# Patient Record
Sex: Female | Born: 1990
Health system: Southern US, Community
[De-identification: ages and names within clinical notes are randomized; demographics above are authoritative.]

## PROBLEM LIST (undated history)

## (undated) HISTORY — PX: NO PAST SURGERIES: SHX2092

---

## 2013-07-27 ENCOUNTER — Emergency Department (HOSPITAL_COMMUNITY)
Admission: EM | Admit: 2013-07-27 | Discharge: 2013-07-28 | Disposition: A | Discharge status: Home / Self Care | Payer: Managed Care, Other (non HMO) | Attending: Emergency Medicine | Admitting: Emergency Medicine

## 2013-07-27 DIAGNOSIS — Z3202 Encounter for pregnancy test, result negative: Secondary | ICD-10-CM | POA: Insufficient documentation

## 2013-07-27 DIAGNOSIS — R0602 Shortness of breath: Secondary | ICD-10-CM | POA: Insufficient documentation

## 2013-07-27 DIAGNOSIS — Z79899 Other long term (current) drug therapy: Secondary | ICD-10-CM | POA: Insufficient documentation

## 2013-07-27 DIAGNOSIS — R5381 Other malaise: Secondary | ICD-10-CM | POA: Insufficient documentation

## 2013-07-27 DIAGNOSIS — R0789 Other chest pain: Secondary | ICD-10-CM | POA: Insufficient documentation

## 2013-07-27 DIAGNOSIS — B349 Viral infection, unspecified: Secondary | ICD-10-CM

## 2013-07-27 DIAGNOSIS — B9789 Other viral agents as the cause of diseases classified elsewhere: Secondary | ICD-10-CM | POA: Insufficient documentation

## 2013-07-27 DIAGNOSIS — R000 Tachycardia, unspecified: Secondary | ICD-10-CM | POA: Insufficient documentation

## 2013-07-28 ENCOUNTER — Encounter (HOSPITAL_COMMUNITY): Payer: Self-pay | Admitting: Emergency Medicine

## 2013-07-28 ENCOUNTER — Emergency Department (HOSPITAL_COMMUNITY): Payer: Managed Care, Other (non HMO)

## 2013-07-28 LAB — URINALYSIS, ROUTINE W REFLEX MICROSCOPIC
Glucose, UA: NEGATIVE mg/dL
Hgb urine dipstick: NEGATIVE
Ketones, ur: NEGATIVE mg/dL
Protein, ur: NEGATIVE mg/dL
Urobilinogen, UA: 1 mg/dL (ref 0.0–1.0)

## 2013-07-28 LAB — POCT PREGNANCY, URINE: Preg Test, Ur: NEGATIVE

## 2013-07-28 LAB — URINE MICROSCOPIC-ADD ON

## 2013-07-28 MED ORDER — OSELTAMIVIR PHOSPHATE 75 MG PO CAPS: 75.0000 mg | ORAL_CAPSULE | Freq: Two times a day (BID) | ORAL | Status: DC

## 2013-07-28 MED ORDER — ACETAMINOPHEN 325 MG PO TABS
650.0000 mg | ORAL_TABLET | Freq: Once | ORAL | Status: AC
  Administered 2013-07-28: 650 mg via ORAL
  Filled 2013-07-28: qty 2

## 2013-07-28 NOTE — ED Provider Notes (Signed)
Medical screening examination/treatment/procedure(s) were performed by non-physician practitioner and as supervising physician I was immediately available for consultation/collaboration.    Sunnie Nielsen, MD 07/28/13 [phone]

## 2013-07-28 NOTE — ED Provider Notes (Signed)
CSN: [id number]     Arrival date & time 07/27/13  2356 History   First MD Initiated Contact with Patient 07/28/13 0053     Chief Complaint  Patient presents with  . Fever  . Shortness of Breath   HPI  History provided by the patient and friends. Patient is a 22 year old female with no significant PMH who presents with complaints of fatigue, fever and chills. Patient reports having slight symptoms are than a day but really began to feel worse this evening. She states she feels extremely tired and exhausted. She also has occasional feelings of shortness of breath and tightness. She denies any significant pains in the chest. No coughing. No nasal congestion, sore throat or ear pain. Denies any headache or neck stiffness. She denies any specific known sick contacts. Denies any recent travel. She has not used any treatments for symptoms. No other aggravating or alleviating factors. No other associated symptoms the    History reviewed. No pertinent past medical history. History reviewed. No pertinent past surgical history. History reviewed. No pertinent family history. History  Substance Use Topics  . Smoking status: Never Smoker   . Smokeless tobacco: Never Used  . Alcohol Use: Yes     Comment: occ.   OB History   Grav Para Term Preterm Abortions TAB SAB Ect Mult Living                 Review of Systems  Constitutional: Positive for fever, chills and fatigue.  HENT: Negative for ear pain, rhinorrhea and sore throat.   Respiratory: Negative for cough and wheezing.   Cardiovascular: Positive for chest pain.  Gastrointestinal: Negative for nausea, vomiting, abdominal pain and diarrhea.  Genitourinary: Negative for dysuria, frequency, hematuria, flank pain, vaginal bleeding, vaginal discharge and menstrual problem.  Skin: Negative for rash.  Neurological: Negative for headaches.  All other systems reviewed and are negative.    Allergies  Review of patient's allergies indicates no  known allergies.  Home Medications   Current Outpatient Rx  Name  Route  Sig  Dispense  Refill  . PRESCRIPTION MEDICATION   Oral   Take 1 tablet by mouth daily. Birth control tablet          BP 122/69  Pulse 105  Temp(Src) 99.6 F (37.6 C) (Oral)  Resp 18  Ht 5\' 9"  (1.753 m)  Wt 160 lb (72.576 kg)  BMI 23.62 kg/m2  SpO2 99%  LMP 07/19/2013 Physical Exam  Nursing note and vitals reviewed. Constitutional: She is oriented to person, place, and time. She appears well-developed and well-nourished. No distress.  HENT:  Head: Normocephalic and atraumatic.  Right Ear: Tympanic membrane normal.  Left Ear: Tympanic membrane normal.  Nose: Nose normal.  Mouth/Throat: Uvula is midline, oropharynx is clear and moist and mucous membranes are normal.  Eyes: Conjunctivae and EOM are normal. Pupils are equal, round, and reactive to light.  Neck: Normal range of motion. Neck supple.  No meningeal signs  Cardiovascular: Regular rhythm.  Tachycardia present.   Pulmonary/Chest: Effort normal and breath sounds normal. No respiratory distress. She has no wheezes. She has no rales.  Abdominal: Soft. Bowel sounds are normal. There is no tenderness. There is no rebound, no guarding and no CVA tenderness.  Neurological: She is alert and oriented to person, place, and time.  Skin: Skin is warm and dry. No rash noted.  Psychiatric: She has a normal mood and affect. Her behavior is normal.    ED Course  Procedures  Results for orders placed during the hospital encounter of 07/27/13  URINALYSIS, ROUTINE W REFLEX MICROSCOPIC      Result Value Range   Color, Urine YELLOW  YELLOW   APPearance CLEAR  CLEAR   Specific Gravity, Urine 1.014  1.005 - 1.030   pH 6.5  5.0 - 8.0   Glucose, UA NEGATIVE  NEGATIVE mg/dL   Hgb urine dipstick NEGATIVE  NEGATIVE   Bilirubin Urine NEGATIVE  NEGATIVE   Ketones, ur NEGATIVE  NEGATIVE mg/dL   Protein, ur NEGATIVE  NEGATIVE mg/dL   Urobilinogen, UA 1.0  0.0 -  1.0 mg/dL   Nitrite NEGATIVE  NEGATIVE   Leukocytes, UA TRACE (*) NEGATIVE  URINE MICROSCOPIC-ADD ON      Result Value Range   Squamous Epithelial / LPF FEW (*) RARE   WBC, UA 0-2  <3 WBC/hpf   Bacteria, UA FEW (*) RARE  POCT PREGNANCY, URINE      Result Value Range   Preg Test, Ur NEGATIVE  NEGATIVE       Dg Chest 2 View  07/28/2013   CLINICAL DATA:  Fever, shortness of breath, flu symptoms.  EXAM: CHEST  2 VIEW  COMPARISON:  None.  FINDINGS: Shallow inspiration. The heart size and mediastinal contours are within normal limits. Both lungs are clear. The visualized skeletal structures are unremarkable.  IMPRESSION: No active cardiopulmonary disease.   Electronically Signed   By: Burman Nieves M.D.   On: 07/28/2013 01:15      MDM   1. Viral infection     Patient seen and evaluated. Patient is well-appearing in no acute distress. She does not appear severely ill or toxic. She is febrile. No other sniffed in symptoms associated with fever.  Chest x-ray and UA unremarkable. Patient does report feeling better after Tylenol. Temperature also improved. At this time suspect viral infection. Patient encouraged on plenty of fluids and symptomatic treatment for fever and infection symptoms. She expressed understanding.      Angus Seller, PA-C 07/28/13 [phone]

## 2013-07-28 NOTE — ED Notes (Signed)
Pt reports that she felt like she was getting a cold earlier today, and began having chills, pt reports difficulty breathing and feels tired. Pt is stating she cannot talk at this time but can answer questions.

## 2014-03-12 ENCOUNTER — Emergency Department (HOSPITAL_COMMUNITY)
Admission: EM | Admit: 2014-03-12 | Discharge: 2014-03-12 | Disposition: A | Discharge status: Home / Self Care | Payer: Managed Care, Other (non HMO) | Attending: Emergency Medicine | Admitting: Emergency Medicine

## 2014-03-12 ENCOUNTER — Emergency Department (HOSPITAL_COMMUNITY): Payer: Managed Care, Other (non HMO)

## 2014-03-12 ENCOUNTER — Encounter (HOSPITAL_COMMUNITY): Payer: Self-pay | Admitting: Emergency Medicine

## 2014-03-12 DIAGNOSIS — Z3490 Encounter for supervision of normal pregnancy, unspecified, unspecified trimester: Secondary | ICD-10-CM

## 2014-03-12 DIAGNOSIS — R112 Nausea with vomiting, unspecified: Secondary | ICD-10-CM

## 2014-03-12 DIAGNOSIS — Z3400 Encounter for supervision of normal first pregnancy, unspecified trimester: Secondary | ICD-10-CM | POA: Insufficient documentation

## 2014-03-12 DIAGNOSIS — N898 Other specified noninflammatory disorders of vagina: Secondary | ICD-10-CM | POA: Insufficient documentation

## 2014-03-12 LAB — ABO/RH: ABO/RH(D): O POS

## 2014-03-12 LAB — URINALYSIS, ROUTINE W REFLEX MICROSCOPIC
Bilirubin Urine: NEGATIVE
GLUCOSE, UA: NEGATIVE mg/dL
Hgb urine dipstick: NEGATIVE
KETONES UR: 15 mg/dL — AB
Nitrite: NEGATIVE
PROTEIN: 30 mg/dL — AB
Specific Gravity, Urine: 1.028 (ref 1.005–1.030)
Urobilinogen, UA: 1 mg/dL (ref 0.0–1.0)
pH: 5.5 (ref 5.0–8.0)

## 2014-03-12 LAB — CBC WITH DIFFERENTIAL/PLATELET
BASOS ABS: 0.1 10*3/uL (ref 0.0–0.1)
BASOS PCT: 1 % (ref 0–1)
Eosinophils Absolute: 0.1 10*3/uL (ref 0.0–0.7)
Eosinophils Relative: 1 % (ref 0–5)
HCT: 34.7 % — ABNORMAL LOW (ref 36.0–46.0)
Hemoglobin: 11.8 g/dL — ABNORMAL LOW (ref 12.0–15.0)
LYMPHS PCT: 17 % (ref 12–46)
Lymphs Abs: 1.6 10*3/uL (ref 0.7–4.0)
MCH: 26.5 pg (ref 26.0–34.0)
MCHC: 34 g/dL (ref 30.0–36.0)
MCV: 77.8 fL — ABNORMAL LOW (ref 78.0–100.0)
Monocytes Absolute: 0.8 10*3/uL (ref 0.1–1.0)
Monocytes Relative: 8 % (ref 3–12)
NEUTROS ABS: 7 10*3/uL (ref 1.7–7.7)
Neutrophils Relative %: 73 % (ref 43–77)
PLATELETS: 374 10*3/uL (ref 150–400)
RBC: 4.46 MIL/uL (ref 3.87–5.11)
RDW: 15.2 % (ref 11.5–15.5)
WBC: 9.6 10*3/uL (ref 4.0–10.5)

## 2014-03-12 LAB — COMPREHENSIVE METABOLIC PANEL
ALK PHOS: 43 U/L (ref 39–117)
ALT: 12 U/L (ref 0–35)
AST: 16 U/L (ref 0–37)
Albumin: 3.8 g/dL (ref 3.5–5.2)
BILIRUBIN TOTAL: 0.6 mg/dL (ref 0.3–1.2)
BUN: 10 mg/dL (ref 6–23)
CO2: 22 mEq/L (ref 19–32)
Calcium: 9.5 mg/dL (ref 8.4–10.5)
Chloride: 101 mEq/L (ref 96–112)
Creatinine, Ser: 0.72 mg/dL (ref 0.50–1.10)
GFR calc non Af Amer: >90 mL/min (ref >90)
Glucose, Bld: 87 mg/dL (ref 70–99)
POTASSIUM: 4.2 meq/L (ref 3.7–5.3)
Sodium: 136 mEq/L — ABNORMAL LOW (ref 137–147)
TOTAL PROTEIN: 7 g/dL (ref 6.0–8.3)

## 2014-03-12 LAB — HIV ANTIBODY (ROUTINE TESTING W REFLEX): HIV: NONREACTIVE

## 2014-03-12 LAB — WET PREP, GENITAL
Trich, Wet Prep: NONE SEEN
Yeast Wet Prep HPF POC: NONE SEEN

## 2014-03-12 LAB — URINE MICROSCOPIC-ADD ON

## 2014-03-12 LAB — LIPASE, BLOOD: Lipase: 21 U/L (ref 11–59)

## 2014-03-12 LAB — HCG, QUANTITATIVE, PREGNANCY: hCG, Beta Chain, Quant, S: 35229 m[IU]/mL — ABNORMAL HIGH (ref <5)

## 2014-03-12 LAB — POC URINE PREG, ED: PREG TEST UR: POSITIVE — AB

## 2014-03-12 MED ORDER — PROMETHAZINE HCL 25 MG PO TABS: 25.0000 mg | ORAL_TABLET | Freq: Four times a day (QID) | ORAL | Status: DC | PRN

## 2014-03-12 MED ORDER — PROMETHAZINE HCL 25 MG RE SUPP: 25.0000 mg | Freq: Four times a day (QID) | RECTAL | Status: DC | PRN

## 2014-03-12 MED ORDER — PROMETHAZINE HCL 25 MG/ML IJ SOLN
25.0000 mg | Freq: Once | INTRAMUSCULAR | Status: AC
  Administered 2014-03-12: 25 mg via INTRAVENOUS
  Filled 2014-03-12: qty 1

## 2014-03-12 MED ORDER — FOLIC ACID 1 MG PO TABS: 1.0000 mg | ORAL_TABLET | Freq: Every day | ORAL | Status: DC

## 2014-03-12 MED ORDER — PYRIDOXINE HCL 100 MG/ML IJ SOLN
100.0000 mg | Freq: Once | INTRAMUSCULAR | Status: AC
  Administered 2014-03-12: 100 mg via INTRAVENOUS
  Filled 2014-03-12: qty 1

## 2014-03-12 MED ORDER — DOXYLAMINE SUCCINATE (SLEEP) 25 MG PO TABS
25.0000 mg | ORAL_TABLET | Freq: Once | ORAL | Status: DC
  Filled 2014-03-12: qty 1

## 2014-03-12 NOTE — ED Notes (Signed)
phamacy notified of medicines needed

## 2014-03-12 NOTE — ED Notes (Signed)
Report given to Anna, RN 

## 2014-03-12 NOTE — ED Notes (Signed)
Taking PO fluids without problem.

## 2014-03-12 NOTE — Discharge Instructions (Signed)
Pregnancy - First Trimester During sexual intercourse, millions of sperm go into the vagina. Only 1 sperm will penetrate and fertilize the female egg while it is in the Fallopian tube. One week later, the fertilized egg implants into the wall of the uterus. An embryo begins to develop into a baby. At 6 to 8 weeks, the eyes and face are formed and the heartbeat can be seen on ultrasound. At the end of 12 weeks (first trimester), all the baby's organs are formed. Now that you are pregnant, you will want to do everything you can to have a healthy baby. Two of the most important things are to get good prenatal care and follow your caregiver's instructions. Prenatal care is all the medical care you receive before the baby's birth. It is given to prevent, find, and treat problems during the pregnancy and childbirth. PRENATAL EXAMS  During prenatal visits, your weight, blood pressure, and urine are checked. This is done to make sure you are healthy and progressing normally during the pregnancy.  A pregnant woman should gain 25 to 35 pounds during the pregnancy. However, if you are overweight or underweight, your caregiver will advise you regarding your weight.  Your caregiver will ask and answer questions for you.  Blood work, cervical cultures, other necessary tests, and a Pap test are done during your prenatal exams. These tests are done to check on your health and the probable health of your baby. Tests are strongly recommended and done for HIV with your permission. This is the virus that causes AIDS. These tests are done because medicines can be given to help prevent your baby from being born with this infection should you have been infected without knowing it. Blood work is also used to find out your blood type, previous infections, and follow your blood levels (hemoglobin).  Low hemoglobin (anemia) is common during pregnancy. Iron and vitamins are given to help prevent this. Later in the pregnancy, blood  tests for diabetes will be done along with any other tests if any problems develop.  You may need other tests to make sure you and the baby are doing well. CHANGES DURING THE FIRST TRIMESTER  Your body goes through many changes during pregnancy. They vary from person to person. Talk to your caregiver about changes you notice and are concerned about. Changes can include:  Your menstrual period stops.  The egg and sperm carry the genes that determine what you look like. Genes from you and your partner are forming a baby. The female genes determine whether the baby is a boy or a girl.  Your body increases in girth and you may feel bloated.  Feeling sick to your stomach (nauseous) and throwing up (vomiting). If the vomiting is uncontrollable, call your caregiver.  Your breasts will begin to enlarge and become tender.  Your nipples may stick out more and become darker.  The need to urinate more. Painful urination may mean you have a bladder infection.  Tiring easily.  Loss of appetite.  Cravings for certain kinds of food.  At first, you may gain or lose a couple of pounds.  You may have changes in your emotions from day to day (excited to be pregnant or concerned something may go wrong with the pregnancy and baby).  You may have more vivid and strange dreams. HOME CARE INSTRUCTIONS   It is very important to avoid all smoking, alcohol and non-prescribed drugs during your pregnancy. These affect the formation and growth of the baby.  Avoid chemicals while pregnant to ensure the delivery of a healthy infant.  Start your prenatal visits by the 12th week of pregnancy. They are usually scheduled monthly at first, then more often in the last 2 months before delivery. Keep your caregiver's appointments. Follow your caregiver's instructions regarding medicine use, blood and lab tests, exercise, and diet.  During pregnancy, you are providing food for you and your baby. Eat regular, well-balanced  meals. Choose foods such as meat, fish, milk and other low fat dairy products, vegetables, fruits, and whole-grain breads and cereals. Your caregiver will tell you of the ideal weight gain.  You can help morning sickness by keeping soda crackers at the bedside. Eat a couple before arising in the morning. You may want to use the crackers without salt on them.  Eating 4 to 5 small meals rather than 3 large meals a day also may help the nausea and vomiting.  Drinking liquids between meals instead of during meals also seems to help nausea and vomiting.  A physical sexual relationship may be continued throughout pregnancy if there are no other problems. Problems may be early (premature) leaking of amniotic fluid from the membranes, vaginal bleeding, or belly (abdominal) pain.  Exercise regularly if there are no restrictions. Check with your caregiver or physical therapist if you are unsure of the safety of some of your exercises. Greater weight gain will occur in the last 2 trimesters of pregnancy. Exercising will help:  Control your weight.  Keep you in shape.  Prepare you for labor and delivery.  Help you lose your pregnancy weight after you deliver your baby.  Wear a good support or jogging bra for breast tenderness during pregnancy. This may help if worn during sleep too.  Ask when prenatal classes are available. Begin classes when they are offered.  Do not use hot tubs, steam rooms, or saunas.  Wear your seat belt when driving. This protects you and your baby if you are in an accident.  Avoid raw meat, uncooked cheese, cat litter boxes, and soil used by cats throughout the pregnancy. These carry germs that can cause birth defects in the baby.  The first trimester is a good time to visit your dentist for your dental health. Getting your teeth cleaned is okay. Use a softer toothbrush and brush gently during pregnancy.  Ask for help if you have financial, counseling, or nutritional needs  during pregnancy. Your caregiver will be able to offer counseling for these needs as well as refer you for other special needs.  Do not take any medicines or herbs unless told by your caregiver.  Inform your caregiver if there is any mental or physical domestic violence.  Make a list of emergency phone numbers of family, friends, hospital, and police and fire departments.  Write down your questions. Take them to your prenatal visit.  Do not douche.  Do not cross your legs.  If you have to stand for long periods of time, rotate you feet or take small steps in a circle.  You may have more vaginal secretions that may require a sanitary pad. Do not use tampons or scented sanitary pads. MEDICINES AND DRUG USE IN PREGNANCY  Take prenatal vitamins as directed. The vitamin should contain 1 milligram of folic acid. Keep all vitamins out of reach of children. Only a couple vitamins or tablets containing iron may be fatal to a baby or young child when ingested.  Avoid use of all medicines, including herbs, over-the-counter medicines, not  prescribed or suggested by your caregiver. Only take over-the-counter or prescription medicines for pain, discomfort, or fever as directed by your caregiver. Do not use aspirin, ibuprofen, or naproxen unless directed by your caregiver.  Let your caregiver also know about herbs you may be using.  Alcohol is related to a number of birth defects. This includes fetal alcohol syndrome. All alcohol, in any form, should be avoided completely. Smoking will cause low birth rate and premature babies.  Street or illegal drugs are very harmful to the baby. They are absolutely forbidden. A baby born to an addicted mother will be addicted at birth. The baby will go through the same withdrawal an adult does.  Let your caregiver know about any medicines that you have to take and for what reason you take them. SEEK MEDICAL CARE IF:  You have any concerns or worries during your  pregnancy. It is better to call with your questions if you feel they cannot wait, rather than worry about them. SEEK IMMEDIATE MEDICAL CARE IF:   An unexplained oral temperature above 102 F (38.9 C) develops, or as your caregiver suggests.  You have leaking of fluid from the vagina (birth canal). If leaking membranes are suspected, take your temperature and inform your caregiver of this when you call.  There is vaginal spotting or bleeding. Notify your caregiver of the amount and how many pads are used.  You develop a bad smelling vaginal discharge with a change in the color.  You continue to feel sick to your stomach (nauseated) and have no relief from remedies suggested. You vomit blood or coffee ground-like materials.  You lose more than 2 pounds of weight in 1 week.  You gain more than 2 pounds of weight in 1 week and you notice swelling of your face, hands, feet, or legs.  You gain 5 pounds or more in 1 week (even if you do not have swelling of your hands, face, legs, or feet).  You get exposed to [place] measles and have never had them.  You are exposed to fifth disease or chickenpox.  You develop belly (abdominal) pain. Round ligament discomfort is a common non-cancerous (benign) cause of abdominal pain in pregnancy. Your caregiver still must evaluate this.  You develop headache, fever, diarrhea, pain with urination, or shortness of breath.  You fall or are in a car accident or have any kind of trauma.  There is mental or physical violence in your home. Document Released: 09/17/2001 Document Revised: 06/17/2012 Document Reviewed: 03/21/2009 [facility] Patient Information 2014 [place].  Folic Acid in Pregnancy Folic acid is a B vitamin that helps prevent neural tube defects (NTDs). The neural tube is the part of a developing baby that becomes the brain and spinal cord. When the neural tube does not close properly, a baby is born with an NTD. NTDs include spina bifida,  hernia of the spinal cord, and the absence of part of, or all of the brain (anencephaly).  Take folic acid at least 4 weeks before getting pregnant and through the first 3 months of pregnancy. This is when the neural tube is developing. It is available in most multivitamins, as a folic acid-only supplement, and in some foods. Taking the right amount of folic acid before conception and during pregnancy lessens the chances of having a baby born with an NTD. Giving folic acid will not affect a neural tube defect if it is already present. DIAGNOSIS   An Alpha-Fetoprotein (AFP) blood or amniotic fluid test  will show high levels of the alpha-feto protein if a woman is carrying a baby with an NTD. This test is done on all pregnant women in the first trimester.  An ultrasound may detect an NTD. WHAT YOU CAN DO:  Take a multivitamin with at least 0.4 milligrams (400 micrograms) of folic acid daily at least 4 weeks before getting pregnant and through the first 12 weeks of pregnancy.  If you have already had a pregnancy affected by an NTD, take 4 milligrams (4,000 micrograms) of folic acid daily. Take this amount 1 month before you start trying to get pregnant and continue through the first 3 months of pregnancy. If you have a seizure disorder or take medicines to control seizures, tell your maternity care provider. Continue to take your folic acid unless you are told otherwise.  FOLIC ACID IN FOODS Eat a healthy diet that has foods that contain folic acid, the natural form of the vitamin. Such foods include:  Fortified breakfast cereals.  Lentils.  Asparagus.  Spinach.  Organ meats (liver).  Black beans.  Peanuts (eat only if you do not have a peanut allergy).  Broccoli.  Strawberries, oranges.  Orange juice (from concentrate is best).  Enriched breads and pasta.  Romaine lettuce. TALK TO YOUR HEALTH CARE PROVIDER IF:  You are in your first trimester and have high blood sugar.  You  are in your first trimester and develop a high fever. In almost all cases, a fetus found to have an NTD will need specialized care that may not be available in all hospitals. Talk to your health care provider about what is best for you and your baby. Document Released: 09/26/2003 Document Revised: 07/14/2013 Document Reviewed: 12/27/2009 [facility] Patient Information 2014 [place].

## 2014-03-12 NOTE — ED Provider Notes (Signed)
CSN: [id number]     Arrival date & time 03/12/14  1301 History   First MD Initiated Contact with Patient 03/12/14 1438     Chief Complaint  Patient presents with  . Emesis During Pregnancy     (Consider location/radiation/quality/duration/timing/severity/associated sxs/prior Treatment) HPI Comments: Patient is a G34 P74 23 year old female who is otherwise healthy who presents today with nausea and vomiting. She states that she took a positive pregnancy test last week and is unsure how far along she is. She has not seen in OB/GYN physician. She  does not have a confirmed IUP. she denies any abdominal pain or cramping. She has an associated vaginal discharge. No vaginal bleeding. Last menstrual period was 01/23/2014. She has been having nonbloody, nonbilious emesis approximately 5-10 minutes after any meals. Initially she was eating large meals, but has started eating small meals. She is still unable to keep any of this down.   The history is provided by the patient. No language interpreter was used.    History reviewed. No pertinent past medical history. History reviewed. No pertinent past surgical history. History reviewed. No pertinent family history. History  Substance Use Topics  . Smoking status: Never Smoker   . Smokeless tobacco: Never Used  . Alcohol Use: Yes     Comment: occ.   OB History   Grav Para Term Preterm Abortions TAB SAB Ect Mult Living                 Review of Systems  Constitutional: Negative for fever and chills.  Respiratory: Negative for shortness of breath.   Cardiovascular: Negative for chest pain.  Gastrointestinal: Positive for nausea and vomiting. Negative for abdominal pain.  Genitourinary: Positive for vaginal discharge. Negative for vaginal bleeding.  All other systems reviewed and are negative.     Allergies  Review of patient's allergies indicates no known allergies.  Home Medications   Prior to Admission medications   Medication Sig Start  Date End Date Taking? Authorizing Provider  oseltamivir (TAMIFLU) 75 MG capsule Take 1 capsule (75 mg total) by mouth every 12 (twelve) hours. 07/28/13   Martie Lee, PA-C  PRESCRIPTION MEDICATION Take 1 tablet by mouth daily. Birth control tablet    Historical Provider, MD   BP 121/49  Pulse 77  Temp(Src) 98.3 F (36.8 C) (Oral)  Resp 18  Ht 5\' 8"  (1.727 m)  Wt 156 lb (70.761 kg)  BMI 23.73 kg/m2  SpO2 97%  LMP 01/23/2014 Physical Exam  Nursing note and vitals reviewed. Constitutional: She is oriented to person, place, and time. She appears well-developed and well-nourished. No distress.  HENT:  Head: Normocephalic and atraumatic.  Right Ear: External ear normal.  Left Ear: External ear normal.  Nose: Nose normal.  Mouth/Throat: Oropharynx is clear and moist.  Eyes: Conjunctivae are normal.  Neck: Normal range of motion.  Cardiovascular: Normal rate, regular rhythm and normal heart sounds.   Pulmonary/Chest: Effort normal and breath sounds normal. No stridor. No respiratory distress. She has no wheezes. She has no rales.  Abdominal: Soft. She exhibits no distension. There is no tenderness.  Genitourinary: There is no rash, tenderness, lesion or injury on the right labia. There is no rash, tenderness, lesion or injury on the left labia. Uterus is not tender. Cervix exhibits discharge. Cervix exhibits no motion tenderness and no friability. Right adnexum displays no mass, no tenderness and no fullness. Left adnexum displays no mass, no tenderness and no fullness. No erythema, tenderness or bleeding around  the vagina. No foreign body around the vagina. No signs of injury around the vagina. Vaginal discharge found.  Cervical os is closed. White malodorous discharge seen on exam.   Musculoskeletal: Normal range of motion.  Neurological: She is alert and oriented to person, place, and time. She has normal strength.  Skin: Skin is warm and dry. She is not diaphoretic. No erythema.   Psychiatric: She has a normal mood and affect. Her behavior is normal.    ED Course  Procedures (including critical care time) Labs Review Labs Reviewed  URINALYSIS, ROUTINE W REFLEX MICROSCOPIC - Abnormal; Notable for the following:    Color, Urine AMBER (*)    APPearance TURBID (*)    Ketones, ur 15 (*)    Protein, ur 30 (*)    Leukocytes, UA SMALL (*)    All other components within normal limits  URINE MICROSCOPIC-ADD ON - Abnormal; Notable for the following:    Squamous Epithelial / LPF MANY (*)    All other components within normal limits  POC URINE PREG, ED - Abnormal; Notable for the following:    Preg Test, Ur POSITIVE (*)    All other components within normal limits  WET PREP, GENITAL  GC/CHLAMYDIA PROBE AMP  CBC WITH DIFFERENTIAL  COMPREHENSIVE METABOLIC PANEL  LIPASE, BLOOD  HCG, QUANTITATIVE, PREGNANCY  HIV ANTIBODY (ROUTINE TESTING)  ABO/RH    Imaging Review US Ob Comp Less 14 Wks  03/12/2014   CLINICAL DATA:  Pelvic pain. Gestational age by LMP of 6 weeks 6 days.  EXAM: OBSTETRIC <14 WK [place] AND TRANSVAGINAL OB US  TECHNIQUE: Both transabdominal and transvaginal ultrasound examinations were performed for complete evaluation of the gestation as well as the maternal uterus, adnexal regions, and pelvic cul-de-sac. Transvaginal technique was performed to assess early pregnancy.  COMPARISON:  None.  FINDINGS: Intrauterine gestational sac: Visualized/normal in shape.  Yolk sac:  Visualized  Embryo:  Visualized  Cardiac Activity: Visualized  Heart Rate:  144 bpm  CRL:   4  mm   6 w 1 d                  [place] EDC: 11/04/2014  Maternal uterus/adnexae: Small right ovarian corpus luteum incidentally noted. Left ovary is normal in appearance. No adnexal mass or free fluid identified.  IMPRESSION: Single living IUP measuring 6 weeks 1 day with [place] EDC of 11/04/2014. This is concordant with LMP.  No significant maternal uterine or adnexal abnormality identified.   Electronically Signed    By: Earle Gell M.D.   On: 03/12/2014 15:48   US Ob Transvaginal  03/12/2014   CLINICAL DATA:  Pelvic pain. Gestational age by LMP of 6 weeks 6 days.  EXAM: OBSTETRIC <14 WK [place] AND TRANSVAGINAL OB US  TECHNIQUE: Both transabdominal and transvaginal ultrasound examinations were performed for complete evaluation of the gestation as well as the maternal uterus, adnexal regions, and pelvic cul-de-sac. Transvaginal technique was performed to assess early pregnancy.  COMPARISON:  None.  FINDINGS: Intrauterine gestational sac: Visualized/normal in shape.  Yolk sac:  Visualized  Embryo:  Visualized  Cardiac Activity: Visualized  Heart Rate:  144 bpm  CRL:   4  mm   6 w 1 d                  [place] EDC: 11/04/2014  Maternal uterus/adnexae: Small right ovarian corpus luteum incidentally noted. Left ovary is normal in appearance. No adnexal mass or free fluid identified.  IMPRESSION: Single  living IUP measuring 6 weeks 1 day with [place] EDC of 11/04/2014. This is concordant with LMP.  No significant maternal uterine or adnexal abnormality identified.   Electronically Signed   By: Earle Gell M.D.   On: 03/12/2014 15:48     EKG Interpretation None      5:13 PM Discussed case with Terri from [facility] who recommends B6, Unisom, and phenergan for symtpoms. Phenergan suppositories can be sent home with patient. Suppositories can be either vaginal or rectal.  6:20 PM Discussed case with Pharmacist. Pharmacy is calling after medication has been given. B6 was large dose IV. Side effects include headache and seizure.   6:31 PM Discussed case with Poison Control who state this is an normal dose to receive IV. The patient and the baby should have no problems.   MDM   Final diagnoses:  Pregnancy  Nausea and vomiting    Patient presents to ED with nausea and vomiting in pregnancy. Patient feels significantly improved after meds in ED and tolerates crackers and ginger ale easily. She was sent home with Phenergan PO and  suppositories. Discussed not to take these together and to use the suppositories when she cannot tolerate any medications PO. Patient was found to have a confirmed IUP on ultrasound measuring 6 weeks and 1 day. She will follow up with an OB/GYN physician. Discussed reasons to return to the ED immediately or to [place] signs stable for discharge. Discussed case with Dr. Canary Brim who agrees with plan. Patient / Family / Caregiver informed of clinical course, understand medical decision-making process, and agree with plan.  Patient did not receive Flagyl in ED. Attempt x 2 was made to call patient to get her Flagyl rx for BV. Flow manager was notified and will get patient Flagyl rx.     Elwyn Lade, PA-C 03/14/14 1011

## 2014-03-12 NOTE — ED Notes (Signed)
Taking  

## 2014-03-12 NOTE — ED Notes (Signed)
Lab at bedside for blood draw.

## 2014-03-12 NOTE — ED Notes (Signed)
Pt reports taking a pregnancy test last week and it was positive but having n/v since then, unable to keep fluids down. No acute distress noted at triage.

## 2014-03-12 NOTE — ED Notes (Signed)
Patient transported to Ultrasound 

## 2014-03-12 NOTE — ED Notes (Signed)
CBC redrawn per lab requests

## 2014-03-14 LAB — GC/CHLAMYDIA PROBE AMP
CT PROBE, AMP APTIMA: NEGATIVE
GC Probe RNA: NEGATIVE

## 2014-03-14 MED ORDER — METRONIDAZOLE 500 MG PO TABS: 500.0000 mg | ORAL_TABLET | Freq: Two times a day (BID) | ORAL | Status: DC

## 2014-03-16 NOTE — ED Provider Notes (Signed)
Medical screening examination/treatment/procedure(s) were performed by non-physician practitioner and as supervising physician I was immediately available for consultation/collaboration.   EKG Interpretation None       Threasa Beards, MD 03/16/14 [phone]

## 2014-11-10 ENCOUNTER — Ambulatory Visit (INDEPENDENT_AMBULATORY_CARE_PROVIDER_SITE_OTHER): Payer: Managed Care, Other (non HMO) | Admitting: Physician Assistant

## 2014-11-10 VITALS — BP 100/60 | HR 79 | Temp 98.7°F | Resp 16 | Ht 70.0 in | Wt 165.0 lb

## 2014-11-10 DIAGNOSIS — Z308 Encounter for other contraceptive management: Secondary | ICD-10-CM

## 2014-11-10 DIAGNOSIS — Z113 Encounter for screening for infections with a predominantly sexual mode of transmission: Secondary | ICD-10-CM

## 2014-11-10 DIAGNOSIS — Z124 Encounter for screening for malignant neoplasm of cervix: Secondary | ICD-10-CM

## 2014-11-10 DIAGNOSIS — Z789 Other specified health status: Secondary | ICD-10-CM

## 2014-11-10 DIAGNOSIS — Z1272 Encounter for screening for malignant neoplasm of vagina: Secondary | ICD-10-CM

## 2014-11-10 DIAGNOSIS — Z0000 Encounter for general adult medical examination without abnormal findings: Secondary | ICD-10-CM

## 2014-11-10 DIAGNOSIS — Z23 Encounter for immunization: Secondary | ICD-10-CM

## 2014-11-10 DIAGNOSIS — Z1389 Encounter for screening for other disorder: Secondary | ICD-10-CM

## 2014-11-10 DIAGNOSIS — Z130 Encounter for screening for diseases of the blood and blood-forming organs and certain disorders involving the immune mechanism: Secondary | ICD-10-CM

## 2014-11-10 LAB — CBC WITH DIFFERENTIAL/PLATELET
BASOS PCT: 1 % (ref 0–1)
Basophils Absolute: 0.1 10*3/uL (ref 0.0–0.1)
EOS PCT: 2 % (ref 0–5)
Eosinophils Absolute: 0.1 10*3/uL (ref 0.0–0.7)
HEMATOCRIT: 40.3 % (ref 36.0–46.0)
Hemoglobin: 13.3 g/dL (ref 12.0–15.0)
Lymphocytes Relative: 26 % (ref 12–46)
Lymphs Abs: 1.8 10*3/uL (ref 0.7–4.0)
MCH: 26.2 pg (ref 26.0–34.0)
MCHC: 33 g/dL (ref 30.0–36.0)
MCV: 79.5 fL (ref 78.0–100.0)
MONO ABS: 0.4 10*3/uL (ref 0.1–1.0)
MPV: 9.7 fL (ref 8.6–12.4)
Monocytes Relative: 5 % (ref 3–12)
NEUTROS PCT: 66 % (ref 43–77)
Neutro Abs: 4.7 10*3/uL (ref 1.7–7.7)
Platelets: 248 10*3/uL (ref 150–400)
RBC: 5.07 MIL/uL (ref 3.87–5.11)
RDW: 15.6 % — ABNORMAL HIGH (ref 11.5–15.5)
WBC: 7.1 10*3/uL (ref 4.0–10.5)

## 2014-11-10 LAB — POCT URINE PREGNANCY: Preg Test, Ur: NEGATIVE

## 2014-11-10 LAB — POCT UA - MICROSCOPIC ONLY
Bacteria, U Microscopic: NEGATIVE
CASTS, UR, LPF, POC: NEGATIVE
CRYSTALS, UR, HPF, POC: NEGATIVE
MUCUS UA: NEGATIVE
Yeast, UA: NEGATIVE

## 2014-11-10 LAB — POCT URINALYSIS DIPSTICK
BILIRUBIN UA: NEGATIVE
Blood, UA: NEGATIVE
Glucose, UA: NEGATIVE
Ketones, UA: NEGATIVE
Leukocytes, UA: NEGATIVE
Nitrite, UA: NEGATIVE
PROTEIN UA: NEGATIVE
SPEC GRAV UA: 1.02
UROBILINOGEN UA: 1
pH, UA: 7.5

## 2014-11-10 LAB — POCT WET PREP WITH KOH
KOH PREP POC: NEGATIVE
TRICHOMONAS UA: NEGATIVE
Yeast Wet Prep HPF POC: NEGATIVE

## 2014-11-10 LAB — RPR

## 2014-11-10 NOTE — Progress Notes (Signed)
11/10/2014 at 1:28 PM  [name] / DOB: Feb 25, 1991 / MRN: [id number]  The patient  does not have a problem list on file.  SUBJECTIVE  Chief compalaint: Annual Exam   History of present illness: [name] is 24 y.o. well appearing female presenting for an annual exam. [name] has no complaints today.    [name] is in a steady relationship for about 11 months and this is a monogamous relationship. [name] had a STI panel that was negative last June or July while in the hospital for pregnancy. [name] was not screened for syphillis and would like this today.    [name] denies a family history of diabetes, HTN, and breast cancer. [name] has not had a pap smear and would like this today.  [name] does not have a PCP.   [name] sleeps 7 hours each night.   Last physical: College entrance Pap smear: None, patient just turned 23 Mammogram: Colonoscopy: Bone density: TDAP: 2013 (college entrance) Pneumovax: NA Zostavax: NA Influenza: None  Eye exam: None Dental exam: Yearly        [name]  has no past medical history on file.    [name] currently has no medications in their medication list.  [name] has No Known Allergies. [name]  reports that [name] has never smoked. [name] has never used smokeless tobacco. [name] reports that [name] drinks 2 glasses of wine every two or three days. [name] reports that [name] does not use illicit drugs. [name]  reports that [name] currently engages in sexual activity. [name] reports using the following method of birth control/protection: Condom.  The patient  has no past surgical history on file.  [name] family history is not on file.  Review of Systems  Constitutional: Negative.   HENT: Negative.   Eyes: Negative.   Respiratory: Negative for cough and shortness of breath.   Cardiovascular: Negative for chest pain, palpitations and leg swelling.  Gastrointestinal: Negative for heartburn, nausea, vomiting, abdominal pain, diarrhea and constipation.  Genitourinary: Negative.   Musculoskeletal: Negative  for myalgias, back pain and neck pain.  Skin: Negative.   Neurological: Negative for dizziness, tingling, tremors, sensory change and speech change.  Endo/Heme/Allergies: Negative for polydipsia.  Psychiatric/Behavioral: Negative for depression. The patient is not nervous/anxious and does not have insomnia.     OBJECTIVE  [name]  height is 5\' 10"  (1.778 m) and weight is 165 lb (74.844 kg). [name] oral temperature is 98.7 F (37.1 C). [name] blood pressure is 100/60 and [name] pulse is 79. [name] respiration is 16 and oxygen saturation is 97%.  The patient's body mass index is 23.68 kg/(m^2).  Physical Exam  No results found for this or any previous visit (from the past 24 hour(s)).  ASSESSMENT & PLAN  [name] was seen today for annual exam.  Diagnoses and associated orders for this visit:  Physical exam, annual  Screening for deficiency anemia - CBC with Differential/Platelet  Screening for cervical cancer - Pap IG w/ reflex to HPV when ASC-U  Routine screening for STI (sexually transmitted infection) - RPR  Screening for vaginal cancer - POCT Wet Prep with KOH  Need for prophylactic vaccination and inoculation against influenza - Flu Vaccine QUAD 36+ mos IM  Screening for nephropathy - POCT urinalysis dipstick  Use of condoms for contraception - POCT UA - Microscopic Only - POCT urine pregnancy    The patient was instructed to to call or comeback to clinic as needed, or should symptoms warrant.  Philis Fendt, MHS, PA-C [facility] 11/10/2014 1:28 PM

## 2014-11-10 NOTE — Addendum Note (Signed)
Addended by: Constance Goltz on: 11/10/2014 03:20 PM   Modules accepted: Miquel Dunn

## 2014-11-10 NOTE — Patient Instructions (Signed)

## 2014-11-11 LAB — PAP IG W/ RFLX HPV ASCU

## 2014-11-17 ENCOUNTER — Encounter: Payer: Self-pay | Admitting: Family Medicine

## 2014-12-04 ENCOUNTER — Ambulatory Visit (INDEPENDENT_AMBULATORY_CARE_PROVIDER_SITE_OTHER): Payer: Managed Care, Other (non HMO) | Admitting: Family Medicine

## 2014-12-04 VITALS — BP 112/70 | HR 75 | Temp 98.7°F | Resp 20 | Ht 68.25 in | Wt 159.2 lb

## 2014-12-04 DIAGNOSIS — R05 Cough: Secondary | ICD-10-CM

## 2014-12-04 DIAGNOSIS — J0100 Acute maxillary sinusitis, unspecified: Secondary | ICD-10-CM

## 2014-12-04 DIAGNOSIS — R059 Cough, unspecified: Secondary | ICD-10-CM

## 2014-12-04 DIAGNOSIS — J209 Acute bronchitis, unspecified: Secondary | ICD-10-CM

## 2014-12-04 MED ORDER — HYDROCODONE-HOMATROPINE 5-1.5 MG/5ML PO SYRP: 5.0000 mL | ORAL_SOLUTION | Freq: Three times a day (TID) | ORAL | Status: DC | PRN

## 2014-12-04 MED ORDER — AMOXICILLIN-POT CLAVULANATE 875-125 MG PO TABS: 1.0000 | ORAL_TABLET | Freq: Two times a day (BID) | ORAL | Status: DC

## 2014-12-04 NOTE — Patient Instructions (Signed)
Use Afrin (oxymetazoline) nasal spray once daily before bedtime     Acute Bronchitis Bronchitis is inflammation of the airways that extend from the windpipe into the lungs (bronchi). The inflammation often causes mucus to develop. This leads to a cough, which is the most common symptom of bronchitis.  In acute bronchitis, the condition usually develops suddenly and goes away over time, usually in a couple weeks. Smoking, allergies, and asthma can make bronchitis worse. Repeated episodes of bronchitis may cause further lung problems.  CAUSES Acute bronchitis is most often caused by the same virus that causes a cold. The virus can spread from person to person (contagious) through coughing, sneezing, and touching contaminated objects. SIGNS AND SYMPTOMS   Cough.   Fever.   Coughing up mucus.   Body aches.   Chest congestion.   Chills.   Shortness of breath.   Sore throat.  DIAGNOSIS  Acute bronchitis is usually diagnosed through a physical exam. Your health care provider will also ask you questions about your medical history. Tests, such as chest X-rays, are sometimes done to rule out other conditions.  TREATMENT  Acute bronchitis usually goes away in a couple weeks. Oftentimes, no medical treatment is necessary. Medicines are sometimes given for relief of fever or cough. Antibiotic medicines are usually not needed but may be prescribed in certain situations. In some cases, an inhaler may be recommended to help reduce shortness of breath and control the cough. A cool mist vaporizer may also be used to help thin bronchial secretions and make it easier to clear the chest.  HOME CARE INSTRUCTIONS  Get plenty of rest.   Drink enough fluids to keep your urine clear or pale yellow (unless you have a medical condition that requires fluid restriction). Increasing fluids may help thin your respiratory secretions (sputum) and reduce chest congestion, and it will prevent dehydration.    Take medicines only as directed by your health care provider.  If you were prescribed an antibiotic medicine, finish it all even if you start to feel better.  Avoid smoking and secondhand smoke. Exposure to cigarette smoke or irritating chemicals will make bronchitis worse. If you are a smoker, consider using nicotine gum or skin patches to help control withdrawal symptoms. Quitting smoking will help your lungs heal faster.   Reduce the chances of another bout of acute bronchitis by washing your hands frequently, avoiding people with cold symptoms, and trying not to touch your hands to your mouth, nose, or eyes.   Keep all follow-up visits as directed by your health care provider.  SEEK MEDICAL CARE IF: Your symptoms do not improve after 1 week of treatment.  SEEK IMMEDIATE MEDICAL CARE IF:  You develop an increased fever or chills.   You have chest pain.   You have severe shortness of breath.  You have bloody sputum.   You develop dehydration.  You faint or repeatedly feel like you are going to pass out.  You develop repeated vomiting.  You develop a severe headache. MAKE SURE YOU:   Understand these instructions.  Will watch your condition.  Will get help right away if you are not doing well or get worse. Document Released: 10/31/2004 Document Revised: 02/07/2014 Document Reviewed: 03/16/2013 [facility] Patient Information 2015 [place], [place]. This information is not intended to replace advice given to you by your health care provider. Make sure you discuss any questions you have with your health care provider.  for 3 days.     Sinusitis Sinusitis is  redness, soreness, and inflammation of the paranasal sinuses. Paranasal sinuses are air pockets within the bones of your face (beneath the eyes, the middle of the forehead, or above the eyes). In healthy paranasal sinuses, mucus is able to drain out, and air is able to circulate through them by way of your nose.  However, when your paranasal sinuses are inflamed, mucus and air can become trapped. This can allow bacteria and other germs to grow and cause infection. Sinusitis can develop quickly and last only a short time (acute) or continue over a long period (chronic). Sinusitis that lasts for more than 12 weeks is considered chronic.  CAUSES  Causes of sinusitis include:  Allergies.  Structural abnormalities, such as displacement of the cartilage that separates your nostrils (deviated septum), which can decrease the air flow through your nose and sinuses and affect sinus drainage.  Functional abnormalities, such as when the small hairs (cilia) that line your sinuses and help remove mucus do not work properly or are not present. SIGNS AND SYMPTOMS  Symptoms of acute and chronic sinusitis are the same. The primary symptoms are pain and pressure around the affected sinuses. Other symptoms include:  Upper toothache.  Earache.  Headache.  Bad breath.  Decreased sense of smell and taste.  A cough, which worsens when you are lying flat.  Fatigue.  Fever.  Thick drainage from your nose, which often is green and may contain pus (purulent).  Swelling and warmth over the affected sinuses. DIAGNOSIS  Your health care provider will perform a physical exam. During the exam, your health care provider may:  Look in your nose for signs of abnormal growths in your nostrils (nasal polyps).  Tap over the affected sinus to check for signs of infection.  View the inside of your sinuses (endoscopy) using an imaging device that has a light attached (endoscope). If your health care provider suspects that you have chronic sinusitis, one or more of the following tests may be recommended:  Allergy tests.  Nasal culture. A sample of mucus is taken from your nose, sent to a lab, and screened for bacteria.  Nasal cytology. A sample of mucus is taken from your nose and examined by your health care provider to  determine if your sinusitis is related to an allergy. TREATMENT  Most cases of acute sinusitis are related to a viral infection and will resolve on their own within 10 days. Sometimes medicines are prescribed to help relieve symptoms (pain medicine, decongestants, nasal steroid sprays, or saline sprays).  However, for sinusitis related to a bacterial infection, your health care provider will prescribe antibiotic medicines. These are medicines that will help kill the bacteria causing the infection.  Rarely, sinusitis is caused by a fungal infection. In theses cases, your health care provider will prescribe antifungal medicine. For some cases of chronic sinusitis, surgery is needed. Generally, these are cases in which sinusitis recurs more than 3 times per year, despite other treatments. HOME CARE INSTRUCTIONS   Drink plenty of water. Water helps thin the mucus so your sinuses can drain more easily.  Use a humidifier.  Inhale steam 3 to 4 times a day (for example, sit in the bathroom with the shower running).  Apply a warm, moist washcloth to your face 3 to 4 times a day, or as directed by your health care provider.  Use saline nasal sprays to help moisten and clean your sinuses.  Take medicines only as directed by your health care provider.  If you  were prescribed either an antibiotic or antifungal medicine, finish it all even if you start to feel better. SEEK IMMEDIATE MEDICAL CARE IF:  You have increasing pain or severe headaches.  You have nausea, vomiting, or drowsiness.  You have swelling around your face.  You have vision problems.  You have a stiff neck.  You have difficulty breathing. MAKE SURE YOU:   Understand these instructions.  Will watch your condition.  Will get help right away if you are not doing well or get worse. Document Released: 09/23/2005 Document Revised: 02/07/2014 Document Reviewed: 10/08/2011 [facility] Patient Information 2015 [place], [place]. This  information is not intended to replace advice given to you by your health care provider. Make sure you discuss any questions you have with your health care provider.

## 2014-12-04 NOTE — Progress Notes (Signed)
Patient ID: Estel Tonelli MRN: [id number], DOB: 30-Jun-1991, 24 y.o. Date of Encounter: 12/04/2014, 12:42 PM  Primary Physician: No PCP Per Patient  Chief Complaint:  Chief Complaint  Patient presents with  . URI    cough with runny nose, vomiting from coughing, nosebleeds.      HPI: 24 y.o. year old female presents with a 21 day history of nasal congestion, post nasal drip, sore throat, and cough. Mild sinus pressure. Afebrile. No chills. Nasal congestion thick and green/yellow. Cough is productive of green/yellow sputum and not associated with time of day. Ears feel full, leading to sensation of muffled hearing. Has tried OTC cold preps without success. No GI complaints.   No sick contacts, recent antibiotics, or recent travels. 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  History reviewed. No pertinent past medical history.   Home Meds: Prior to Admission medications   Medication Sig Start Date End Date Taking? Authorizing Provider  amoxicillin-clavulanate (AUGMENTIN) 875-125 MG per tablet Take 1 tablet by mouth 2 (two) times daily. 12/04/14   Robyn Haber, MD  HYDROcodone-homatropine [facility]) 5-1.5 MG/5ML syrup Take 5 mLs by mouth every 8 (eight) hours as needed for cough. 12/04/14   Robyn Haber, MD    Allergies: No Known Allergies  History   Social History  . Marital Status: Single    Spouse Name: N/A  . Number of Children: N/A  . Years of Education: N/A   Occupational History  . Not on file.   Social History Main Topics  . Smoking status: Never Smoker   . Smokeless tobacco: Never Used  . Alcohol Use: Yes     Comment: occ.  . Drug Use: No  . Sexual Activity: Yes    Birth Control/ Protection: Condom   Other Topics Concern  . Not on file   Social History Narrative     Review of Systems: Constitutional: negative for chills, fever, night sweats or weight changes Cardiovascular: negative for chest pain  or palpitations Respiratory: negative for hemoptysis, wheezing, or shortness of breath Abdominal: negative for abdominal pain, nausea, vomiting or diarrhea Dermatological: negative for rash Neurologic: negative for headache   Physical Exam: Blood pressure 112/70, pulse 75, temperature 98.7 F (37.1 C), temperature source Oral, resp. rate 20, height 5' 8.25" (1.734 m), weight 159 lb 4 oz (72.235 kg), last menstrual period 11/29/2014, SpO2 100 %., Body mass index is 24.02 kg/(m^2). General: Well developed, well nourished, in no acute distress. Head: Normocephalic, atraumatic, eyes without discharge, sclera non-icteric, nares are congested. Bilateral auditory canals clear, TM's are without perforation, pearly grey with reflective cone of light bilaterally. No sinus TTP. Oral cavity moist, dentition normal. Posterior pharynx with post nasal drip and mild erythema. No peritonsillar abscess or tonsillar exudate. Neck: Supple. No thyromegaly. Full ROM. No lymphadenopathy. Lungs: Coarse breath sounds bilaterally without wheezes, rales, or rhonchi. Breathing is unlabored.  Heart: RRR with S1 S2. No murmurs, rubs, or gallops appreciated. Msk:  Strength and tone normal for age. Extremities: No clubbing or cyanosis. No edema. Neuro: Alert and oriented X 3. Moves all extremities spontaneously. CNII-XII grossly in tact. Psych:  Responds to questions appropriately with a normal affect.     ASSESSMENT AND PLAN:  24 y.o. year old female with bronchitis. -   ICD-9-CM ICD-10-CM   1. Acute maxillary sinusitis, recurrence not specified 461.0 J01.00 amoxicillin-clavulanate (AUGMENTIN) 875-125 MG per tablet  2. Cough 786.2 R05 HYDROcodone-homatropine (HYCODAN) 5-1.5 MG/5ML syrup  3. Acute bronchitis, unspecified organism 466.0 J20.9 HYDROcodone-homatropine (HYCODAN) 5-1.5 MG/5ML syrup   -Tylenol/Motrin prn -Rest/fluids -RTC precautions -RTC 3-5 days if no improvement  Signed, Robyn Haber,  MD 12/04/2014 12:42 PM

## 2016-07-01 ENCOUNTER — Ambulatory Visit (HOSPITAL_COMMUNITY)
Admission: EM | Admit: 2016-07-01 | Discharge: 2016-07-01 | Disposition: A | Discharge status: Home / Self Care | Payer: Managed Care, Other (non HMO) | Attending: Family Medicine | Admitting: Family Medicine

## 2016-07-01 ENCOUNTER — Encounter (HOSPITAL_COMMUNITY): Payer: Self-pay | Admitting: Family Medicine

## 2016-07-01 DIAGNOSIS — K529 Noninfective gastroenteritis and colitis, unspecified: Secondary | ICD-10-CM

## 2016-07-01 MED ORDER — ONDANSETRON 8 MG PO TBDP: 8.0000 mg | ORAL_TABLET | Freq: Three times a day (TID) | ORAL | 0 refills | Status: DC | PRN

## 2016-07-01 NOTE — ED Provider Notes (Signed)
[place]    CSN: [id number] Arrival date & time: 07/01/16  1239  First Provider Contact:  First MD Initiated Contact with Patient 07/01/16 1350        History   Chief Complaint Chief Complaint  Patient presents with  . Abdominal Pain    HPI [name] is a 25 y.o. female.   This is a 25 year old woman who presents with nausea and diarrhea. 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  Patient has had no blood in her stools. She did vomit twice this morning. She is keeping down clear liquids now. She's had no cramps or fever. She has no history of ongoing gastrointestinal problems.  No one at home has a same symptoms.      History reviewed. No pertinent past medical history.  There are no active problems to display for this patient.   History reviewed. No pertinent surgical history.  OB History    No data available       Home Medications    Prior to Admission medications   Medication Sig Start Date End Date Taking? Authorizing Provider  ondansetron (ZOFRAN-ODT) 8 MG disintegrating tablet Take 1 tablet (8 mg total) by mouth every 8 (eight) hours as needed for nausea. 07/01/16   Robyn Haber, MD    Family History No family history on file.  Social History Social History  Substance Use Topics  . Smoking status: Never Smoker  . Smokeless tobacco: Never Used  . Alcohol use Yes     Comment: occ.     Allergies   Review of patient's allergies indicates no known allergies.   Review of Systems Review of Systems  Constitutional: Negative.   HENT: Negative.   Eyes: Negative.   Respiratory: Negative.   Cardiovascular: Negative.   Gastrointestinal: Positive for diarrhea, nausea and vomiting.  Genitourinary: Negative.      Physical Exam Triage Vital Signs ED Triage Vitals  Enc Vitals Group     BP      Pulse      Resp      Temp      Temp src      SpO2      Weight      Height      Head  Circumference      Peak Flow      Pain Score      Pain Loc      Pain Edu?      Excl. in [place]?    No data found.   Updated Vital Signs BP 107/66 (BP Location: Left Arm)   Pulse 77   Temp 98.5 F (36.9 C) (Oral)   Resp 12   SpO2 99%   Visual Acuity Right Eye Distance:   Left Eye Distance:   Bilateral Distance:    Right Eye Near:   Left Eye Near:    Bilateral Near:     Physical Exam  Constitutional: She is oriented to person, place, and time. She appears well-developed and well-nourished.  HENT:  Head: Normocephalic.  Right Ear: External ear normal.  Left Ear: External ear normal.  Mouth/Throat: Oropharynx is clear and moist.  Eyes: Conjunctivae and EOM are normal. Pupils are equal, round, and reactive to light.  Neck: Normal range of motion. Neck supple.  Cardiovascular: Regular rhythm.   Pulmonary/Chest: Effort normal.  Abdominal: Soft. Bowel sounds are normal. She exhibits no distension and no mass. There is no tenderness.  Musculoskeletal: Normal range of motion.  Neurological: She is alert and oriented to person, place, and time.  Skin: Skin is warm and dry.  Nursing note and vitals reviewed.    UC Treatments / Results  Labs (all labs ordered are listed, but only abnormal results are displayed) Labs Reviewed - No data to display  EKG  EKG Interpretation None       Radiology No results found.  Procedures Procedures (including critical care time)  Medications Ordered in UC Medications - No data to display   Initial Impression / Assessment and Plan / UC Course  I have reviewed the triage vital signs and the nursing notes.  Pertinent labs & imaging results that were available during my care of the patient were reviewed by me and considered in my medical decision making (see chart for details).  Clinical Course     Final Clinical Impressions(s) / UC Diagnoses   Final diagnoses:  Gastroenteritis    New Prescriptions New Prescriptions    ONDANSETRON (ZOFRAN-ODT) 8 MG DISINTEGRATING TABLET    Take 1 tablet (8 mg total) by mouth every 8 (eight) hours as needed for nausea.     Robyn Haber, MD 07/01/16 [phone]

## 2016-07-01 NOTE — Discharge Instructions (Signed)
These symptoms should clear in the next 24 hours. Stay on clear liquids today.

## 2016-07-01 NOTE — ED Triage Notes (Signed)
Vomiting and diarrhea since yesterday.  Reports vomiting x 3 times today and having diarrhea stools x 2 today.  Denies any room mates or acquaintances having symptoms.

## 2016-08-11 ENCOUNTER — Encounter (HOSPITAL_COMMUNITY): Payer: Self-pay | Admitting: Emergency Medicine

## 2016-08-11 ENCOUNTER — Emergency Department (HOSPITAL_COMMUNITY)
Admission: EM | Admit: 2016-08-11 | Discharge: 2016-08-12 | Disposition: A | Discharge status: Home / Self Care | Payer: Managed Care, Other (non HMO) | Attending: Emergency Medicine | Admitting: Emergency Medicine

## 2016-08-11 DIAGNOSIS — T24232A Burn of second degree of left lower leg, initial encounter: Secondary | ICD-10-CM | POA: Diagnosis not present

## 2016-08-11 DIAGNOSIS — Y929 Unspecified place or not applicable: Secondary | ICD-10-CM | POA: Insufficient documentation

## 2016-08-11 DIAGNOSIS — T24202A Burn of second degree of unspecified site of left lower limb, except ankle and foot, initial encounter: Secondary | ICD-10-CM

## 2016-08-11 DIAGNOSIS — X102XXA Contact with fats and cooking oils, initial encounter: Secondary | ICD-10-CM | POA: Insufficient documentation

## 2016-08-11 DIAGNOSIS — Z23 Encounter for immunization: Secondary | ICD-10-CM | POA: Insufficient documentation

## 2016-08-11 DIAGNOSIS — Y999 Unspecified external cause status: Secondary | ICD-10-CM | POA: Insufficient documentation

## 2016-08-11 DIAGNOSIS — T23221A Burn of second degree of single right finger (nail) except thumb, initial encounter: Secondary | ICD-10-CM | POA: Diagnosis not present

## 2016-08-11 DIAGNOSIS — T25021A Burn of unspecified degree of right foot, initial encounter: Secondary | ICD-10-CM | POA: Diagnosis present

## 2016-08-11 DIAGNOSIS — T25221A Burn of second degree of right foot, initial encounter: Secondary | ICD-10-CM | POA: Insufficient documentation

## 2016-08-11 DIAGNOSIS — Y93G3 Activity, cooking and baking: Secondary | ICD-10-CM | POA: Diagnosis not present

## 2016-08-11 DIAGNOSIS — T25222A Burn of second degree of left foot, initial encounter: Secondary | ICD-10-CM | POA: Diagnosis not present

## 2016-08-11 DIAGNOSIS — T24221A Burn of second degree of right knee, initial encounter: Secondary | ICD-10-CM | POA: Insufficient documentation

## 2016-08-11 MED ORDER — SILVER SULFADIAZINE 1 % EX CREA
TOPICAL_CREAM | Freq: Every day | CUTANEOUS | Status: DC
  Administered 2016-08-11: via TOPICAL
  Filled 2016-08-11: qty 50

## 2016-08-11 MED ORDER — MORPHINE SULFATE (PF) 2 MG/ML IV SOLN
4.0000 mg | Freq: Once | INTRAVENOUS | Status: AC
  Administered 2016-08-11: 4 mg via INTRAVENOUS
  Filled 2016-08-11: qty 2

## 2016-08-11 MED ORDER — OXYCODONE-ACETAMINOPHEN 5-325 MG PO TABS
1.0000 | ORAL_TABLET | Freq: Once | ORAL | Status: AC
  Administered 2016-08-11: 1 via ORAL
  Filled 2016-08-11: qty 1

## 2016-08-11 MED ORDER — OXYCODONE-ACETAMINOPHEN 5-325 MG PO TABS: 1.0000 | ORAL_TABLET | ORAL | 0 refills | Status: DC | PRN

## 2016-08-11 MED ORDER — ONDANSETRON HCL 4 MG PO TABS: 4.0000 mg | ORAL_TABLET | Freq: Four times a day (QID) | ORAL | 0 refills | Status: DC

## 2016-08-11 MED ORDER — SILVER SULFADIAZINE 1 % EX CREA: 1.0000 "application " | TOPICAL_CREAM | Freq: Every day | CUTANEOUS | 0 refills | Status: DC

## 2016-08-11 MED ORDER — SODIUM CHLORIDE 0.9 % IV BOLUS (SEPSIS)
1000.0000 mL | Freq: Once | INTRAVENOUS | Status: AC
  Administered 2016-08-11: 1000 mL via INTRAVENOUS

## 2016-08-11 MED ORDER — TETANUS-DIPHTH-ACELL PERTUSSIS 5-2.5-18.5 LF-MCG/0.5 IM SUSP
0.5000 mL | Freq: Once | INTRAMUSCULAR | Status: AC
  Administered 2016-08-11: 0.5 mL via INTRAMUSCULAR
  Filled 2016-08-11: qty 0.5
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
## 2018-11-06 ENCOUNTER — Encounter (HOSPITAL_COMMUNITY): Payer: Self-pay | Admitting: Emergency Medicine

## 2018-11-06 ENCOUNTER — Emergency Department (HOSPITAL_COMMUNITY): Payer: PRIVATE HEALTH INSURANCE

## 2018-11-06 ENCOUNTER — Emergency Department (HOSPITAL_COMMUNITY)
Admission: EM | Admit: 2018-11-06 | Discharge: 2018-11-06 | Disposition: A | Discharge status: Home / Self Care | Payer: PRIVATE HEALTH INSURANCE | Attending: Emergency Medicine | Admitting: Emergency Medicine

## 2018-11-06 DIAGNOSIS — R55 Syncope and collapse: Secondary | ICD-10-CM | POA: Diagnosis not present

## 2018-11-06 LAB — BASIC METABOLIC PANEL
Anion gap: 15 (ref 5–15)
BUN: 18 mg/dL (ref 6–20)
CO2: 19 mmol/L — ABNORMAL LOW (ref 22–32)
Calcium: 10.3 mg/dL (ref 8.9–10.3)
Chloride: 104 mmol/L (ref 98–111)
Creatinine, Ser: 1.1 mg/dL — ABNORMAL HIGH (ref 0.44–1.00)
GFR calc Af Amer: >60 mL/min (ref >60)
Glucose, Bld: 94 mg/dL (ref 70–99)
Potassium: 4.1 mmol/L (ref 3.5–5.1)
Sodium: 138 mmol/L (ref 135–145)

## 2018-11-06 LAB — CBC
HCT: 42.2 % (ref 36.0–46.0)
Hemoglobin: 13.7 g/dL (ref 12.0–15.0)
MCH: 26.4 pg (ref 26.0–34.0)
MCHC: 32.5 g/dL (ref 30.0–36.0)
MCV: 81.5 fL (ref 80.0–100.0)
Platelets: 316 10*3/uL (ref 150–400)
RBC: 5.18 MIL/uL — ABNORMAL HIGH (ref 3.87–5.11)
RDW: 14 % (ref 11.5–15.5)
WBC: 8.8 10*3/uL (ref 4.0–10.5)
nRBC: 0 % (ref 0.0–0.2)

## 2018-11-06 LAB — I-STAT BETA HCG BLOOD, ED (MC, WL, AP ONLY)

## 2018-11-06 MED ORDER — SODIUM CHLORIDE 0.9% FLUSH
3.0000 mL | Freq: Once | INTRAVENOUS | Status: AC
  Administered 2018-11-06: 3 mL via INTRAVENOUS
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
## 2018-11-06 NOTE — ED Notes (Signed)
Patient transported to CT 

## 2018-11-06 NOTE — ED Provider Notes (Addendum)
[facility] EMERGENCY DEPARTMENT Provider Note   CSN: [id number] Arrival date & time: 11/06/18  2054     History   Chief Complaint Chief Complaint  Patient presents with  . Loss of Consciousness    HPI [name] is a 28 y.o. female.  HPI   [name] is a 28 y.o. female with PMH of none who presents from upstairs where she works after a syncopal episode.  The patient reports that she ate and drank normally today and felt normal until she went in to get vital signs on the patient.  Thereafter she felt lightheaded and warm and flushed.  It worsened while she was in the room and she walked out toward the bathroom.  Mated to the nurses station and then rapidly lost consciousness.  She is not sure if she hit her head or neck.  Remembers waking up and feeling that she was breathing fast and felt tingling around her mouth and in her arms and hands.  She had a mild headache and bilateral blurred vision which was equal without eye pain at that time.  No longer has a headache or blurred vision.  Reports some mild numbness in her dorsal right hand at this time and some mild pain in her right lateral thigh.  Has not attempted to ambulate.  No chest pain or dyspnea at this time or prior to syncopized.  No palpitations.  No history of thyroid disease or other acute pathology.  No history of cardiac disease.  No family history of sudden cardiac death or otherwise unexplained deaths including MVC's or drowning.  Does not take anticoagulation.  No recent falls or trauma other than syncopized in tonight.  She reports that she passed out one other time in the past when she did not have as much to eat or drink as she normally would.  She has had no nausea or vomiting.  No pelvic pain or vaginal discharge or bleeding.  No urinary symptoms.  No rashes or swelling.  History reviewed. No pertinent past medical history.  There are no active problems to display for this  patient.   History reviewed. No pertinent surgical history.   OB History   No obstetric history on file.      Home Medications    Prior to Admission medications   Medication Sig Start Date End Date Taking? Authorizing Provider  ibuprofen (ADVIL,MOTRIN) 200 MG tablet Take 400 mg by mouth every 6 (six) hours as needed for headache, mild pain or moderate pain.   Yes [provider]  ondansetron (ZOFRAN) 4 MG tablet Take 1 tablet (4 mg total) by mouth every 6 (six) hours. Patient not taking: Reported on 11/06/2018 08/11/16   Long, Wonda Olds, MD  ondansetron (ZOFRAN-ODT) 8 MG disintegrating tablet Take 1 tablet (8 mg total) by mouth every 8 (eight) hours as needed for nausea. Patient not taking: Reported on 11/06/2018 07/01/16   Robyn Haber, MD  oxyCODONE-acetaminophen (PERCOCET/ROXICET) 5-325 MG tablet Take 1-2 tablets by mouth every 4 (four) hours as needed for severe pain. Patient not taking: Reported on 11/06/2018 08/11/16   Long, Wonda Olds, MD  silver sulfADIAZINE (SILVADENE) 1 % cream Apply 1 application topically daily. Patient not taking: Reported on 11/06/2018 08/11/16   Long, Wonda Olds, MD    Family History No family history on file.  Social History Social History   Tobacco Use  . Smoking status: Never Smoker  . Smokeless tobacco: Never Used  Substance Use Topics  .  Alcohol use: Yes    Comment: occ.  . Drug use: No     Allergies   Patient has no known allergies.   Review of Systems Review of Systems  Constitutional: Negative for chills and fever.  HENT: Negative for ear pain and sore throat.   Eyes: Positive for visual disturbance. Negative for pain.  Respiratory: Negative for cough and shortness of breath.   Cardiovascular: Negative for chest pain and palpitations.  Gastrointestinal: Negative for abdominal pain and vomiting.  Genitourinary: Negative for dysuria and hematuria.  Musculoskeletal: Positive for arthralgias and myalgias. Negative for back  pain.  Skin: Negative for color change and rash.  Neurological: Positive for syncope, light-headedness, numbness and headaches. Negative for seizures.  All other systems reviewed and are negative.    Physical Exam Updated Vital Signs BP 111/63   Pulse 70   Temp 98.9 F (37.2 C) (Oral)   Resp (!) 27   SpO2 97%   Physical Exam Vitals signs and nursing note reviewed.  Constitutional:      General: She is not in acute distress.    Appearance: Normal appearance. She is well-developed. She is not ill-appearing or diaphoretic.  HENT:     Head: Normocephalic and atraumatic.  Eyes:     General: Lids are normal. Vision grossly intact.     Conjunctiva/sclera: Conjunctivae normal.     Pupils: Pupils are equal, round, and reactive to light.  Neck:     Musculoskeletal: Full passive range of motion without pain and neck supple. No spinous process tenderness or muscular tenderness.  Cardiovascular:     Rate and Rhythm: Regular rhythm. Tachycardia present.     Heart sounds: S1 normal and S2 normal. No murmur.     Comments: Tachycardic during triage but heart rate in the 80s on my evaluation. Pulmonary:     Effort: Pulmonary effort is normal. No respiratory distress.     Breath sounds: Normal breath sounds.  Abdominal:     General: Abdomen is flat.     Palpations: Abdomen is soft.     Tenderness: There is no abdominal tenderness. There is no guarding or rebound. Negative signs include Murphy's sign, Rovsing's sign and McBurney's sign.  Skin:    General: Skin is warm and dry.  Neurological:     General: No focal deficit present.     Mental Status: She is alert and oriented to person, place, and time.     GCS: GCS eye subscore is 4. GCS verbal subscore is 5. GCS motor subscore is 6.     Cranial Nerves: Cranial nerves are intact.     Sensory: Sensory deficit (slight decrease in sensation to light touch dorsal right hand. Resolved on repeat.) present.     Motor: Motor function is intact.      Coordination: Coordination is intact. Coordination normal. Finger-Nose-Finger Test normal. Rapid alternating movements normal.     Gait: Gait is intact.  Psychiatric:        Behavior: Behavior is cooperative.     ED Treatments / Results  Labs (all labs ordered are listed, but only abnormal results are displayed) Labs Reviewed  BASIC METABOLIC PANEL - Abnormal; Notable for the following components:      Result Value   CO2 19 (*)    Creatinine, Ser 1.10 (*)    All other components within normal limits  CBC - Abnormal; Notable for the following components:   RBC 5.18 (*)    All other components within normal limits  URINALYSIS, ROUTINE W REFLEX MICROSCOPIC  I-STAT BETA HCG BLOOD, ED ([facility], [facility], [facility] ONLY)  CBG MONITORING, ED    EKG EKG Interpretation  Date/Time:  Friday November 06 2018 21:03:56 EST Ventricular Rate:  112 PR Interval:    QRS Duration: 98 QT Interval:  336 QTC Calculation: 459 R Axis:   83 Text Interpretation:  Sinus tachycardia Borderline Q waves in lateral leads Borderline T abnormalities, diffuse leads No old tracing to compare Reconfirmed by [place], Inocente Salles [phone]) on 11/06/2018 9:24:12 PM   Radiology Ct Head Wo Contrast  Result Date: 11/06/2018 CLINICAL DATA:  Pt was working on 4[employer] when she had a syncopal episode and hit her head on the ground. Pt had LOC for about 1 minute per witness. Pt was able to stand after she gained consciousness. Complaining of bilateral hand numbness and ting,le sensation after incident. Currently only feels numbness on right hand only. Pt complaining of headache and also blurred vision. EXAM: CT HEAD WITHOUT CONTRAST TECHNIQUE: Contiguous axial images were obtained from the base of the skull through the vertex without intravenous contrast. COMPARISON:  None. FINDINGS: Brain: No evidence of acute infarction, hemorrhage, hydrocephalus, extra-axial collection or mass lesion/mass effect. Vascular: No hyperdense vessel or unexpected  calcification. Skull: Normal. Negative for fracture or focal lesion. Sinuses/Orbits: Globes and orbits are within normal limits. The visualized sinuses and mastoid air cells are clear. Other: None. IMPRESSION: Normal unenhanced CT scan of the brain. Electronically Signed   By: Lajean Manes M.D.   On: 11/06/2018 21:41    Procedures Procedures (including critical care time)  Medications Ordered in ED Medications  sodium chloride flush (NS) 0.9 % injection 3 mL (3 mLs Intravenous Given 11/06/18 2111)     Initial Impression / Assessment and Plan / ED Course  I have reviewed the triage vital signs and the nursing notes.  Pertinent labs & imaging results that were available during my care of the patient were reviewed by me and considered in my medical decision making (see chart for details).     MDM:  Imaging: CT head shows no acute intracranial abnormality.  ED Provider Interpretation of EKG: Initial EKG sinus tachycardia with a rate of 112 bpm, normal axis, no ST segment elevation or depression, pathologic T wave changes or significant interval irregularities.  QTC mildly prolonged at 459.  No bundle-branch blocks.  No delta wave.  No evidence for Brugada syndrome.  No convincing evidence for LVH.  Repeat EKG at the bedside shows sinus rhythm with a rate of 81 bpm and normal axis.  No ST segment elevations or other pathologies.  Labs: CBC normal, BMP with CO2 of 19 otherwise normal, hCG undetectable  On initial evaluation, patient appears well. Afebrile and hemodynamically stable although tachycardic on initial triage heart rate decreased to 80s on my evaluation.  Normotensive. Alert and oriented x4, pleasant, and cooperative.  Presents with syncope as detailed above.  On exam, patient has no focal abnormality.  Slight decrease in sensation on initial presentation to the right dorsal hand.  This resolved.  No pain on exam.  No sensory deficits otherwise.  No pain over the thenar eminence and  no decreased sensation.  Negative Phalen's and Tinel sign.  Doubt carpal tunnel.  No decrease in sensation or strength proximal to the right hand or elsewhere.  Low suspicion for CVA or TIA.  No neck pain on palpation with movement.  No evidence for myelopathy or radiculopathy.  Doubt cervical cord pathology.  Patient did hit  head and had headache after waking with mild blurred vision.  CT head was ordered by nursing prior to my evaluation and resulted showing no acute pathology.  EKG showed sinus tachycardia initially without ischemic changes.  No evidence for right heart strain.  Repeat EKG with heart rate in the 80s.  No ST segment pathologies or evidence for LVH.  No murmurs noted on exam.  No bundle-branch blocks.  No evidence for Brugada syndrome or WPW.  QTc is mildly prolonged but only in the 450s.  No personal or family history of significant cardiac disease.  No family history of sudden or otherwise unexplained death.  She did pass out when she was 28 years old after not eating much throughout the day.  States that she thought she ate a normal amount today.  However, she has mild metabolic acidosis with a CO2 of 19 and a gap of 15.  Suspect component of ketosis likely secondary to mild dehydration.  Creatinine elevated slightly from her prior in 2015 but no interval labs available.  Received IV fluid bolus in the ED prior to my evaluation.  Counseled on importance of continuing increased p.o. intake over the next several days.  She verbalized understanding pregnancy negative.  Glucose normal.  There was no seizure-like activity noted upstairs and no urinary incontinence or tongue biting.  No history of seizure.  Doubt seizure at this time.  Patient was discharged home with PCP follow-up and counseled on importance of increasing p.o. liquids.  Counseled on importance of following up with cardiology as she may benefit from further outpatient testing such as rhythm monitoring or echocardiogram.  She  verbalized understanding.  Given return precautions.  The plan for this patient was discussed with Dr. Winfred Leeds who voiced agreement and who oversaw evaluation and treatment of this patient.   The patient was fully informed and involved with the history taking, evaluation, workup including labs/images, and plan. The patient's concerns and questions were addressed to the patient's satisfaction and she expressed agreement with the plan to DC home.    Final Clinical Impressions(s) / ED Diagnoses   Final diagnoses:  Syncope, unspecified syncope type    ED Discharge Orders    None       Rose-Marie Hickling, Rodena Goldmann, MD 11/06/18 2318    Louellen Molder, MD 11/06/18 3474    Orlie Dakin, MD 11/07/18 [phone]

## 2018-11-06 NOTE — ED Notes (Signed)
Pt states her mouth feels numb

## 2018-11-06 NOTE — ED Notes (Signed)
Reviewed d/c instructions with pt, who verbalized understanding and had no outstanding questions. Pt departed in NAD, refused use of wheelchair.   

## 2018-11-06 NOTE — ED Provider Notes (Signed)
Patient had syncopal event this evening while at work.  She reports that she is lightheaded for 1 to 2 minutes immediately prior to the event and then people were standing over her.  She had a syncopal event when she did not eat or drink enough at age 28.  Upon regaining consciousness she had transient blurred vision and tingling in her fingers which has since resolved.  She is presently asymptomatic except for mild tenderness at right lateral thigh since falling.  On exam she is alert Glasgow Coma Score 15 lungs clear to auscultation heart regular rate and rhythm no murmurs or rubs pelvis stable nontender all 4 extremities without deformity or swelling neurovascular intact.  Neurologic Glasgow Coma Score 15 gait normal moves all extremities well motor strength 5/5 overall cranial nerves II through XII grossly intact   Orlie Dakin, MD 11/06/18 2238

## 2018-11-06 NOTE — Significant Event (Signed)
Rapid Response Event Note Called by [facility] nursing staff for fall with report of person hitting their head  Overview: Time Called: 2035 Arrival Time: 2038 Event Type: Other (Comment)(s/p fall with LOC)  Initial Focused Assessment: Upon arrival, [name] was lying on the floor, supine, spontaneously breathing with a palpable peripheral pulse.  She opened her eyes after a brief time of repeated verbal stimulus.  She was able to nod her head to questions.  She eventually began to answer questions and move all four extremities.  She complained of bilateral hand numbness but fine and gross motor function of both hands intact.  She also complains of dizzyness. Denies pain, nausea and no history of seizures or diabetes per [name].  She was able to stand with the assist of two to the wheelchair.  She reported after standing that she could not see.  She stated she wears glasses.  She was transported in a wheelchair to the ED.  Primary care was transferred to [facility].   Interventions: -Placed in a wheelchair and taken to the ED.    Event Summary:   at      at    Outcome: Other (Comment)(Taken to the ED.)  Event End Time: 2107  Madelynn Done

## 2019-03-19 ENCOUNTER — Other Ambulatory Visit (HOSPITAL_COMMUNITY)
Admission: RE | Admit: 2019-03-19 | Discharge: 2019-03-19 | Disposition: A | Discharge status: Home / Self Care | Payer: No Typology Code available for payment source | Source: Ambulatory Visit | Attending: Internal Medicine | Admitting: Internal Medicine

## 2019-03-19 ENCOUNTER — Ambulatory Visit (INDEPENDENT_AMBULATORY_CARE_PROVIDER_SITE_OTHER): Payer: No Typology Code available for payment source | Admitting: Internal Medicine

## 2019-03-19 ENCOUNTER — Telehealth: Payer: Self-pay | Admitting: *Deleted

## 2019-03-19 ENCOUNTER — Other Ambulatory Visit: Payer: Self-pay

## 2019-03-19 VITALS — BP 96/51 | HR 84 | Temp 99.1°F | Ht 69.0 in | Wt 149.1 lb

## 2019-03-19 DIAGNOSIS — Z30011 Encounter for initial prescription of contraceptive pills: Secondary | ICD-10-CM | POA: Diagnosis not present

## 2019-03-19 DIAGNOSIS — Z0000 Encounter for general adult medical examination without abnormal findings: Secondary | ICD-10-CM | POA: Insufficient documentation

## 2019-03-19 LAB — POCT URINE PREGNANCY: Preg Test, Ur: NEGATIVE

## 2019-03-19 MED ORDER — NORGESTIMATE-ETH ESTRADIOL 0.25-35 MG-MCG PO TABS: 1.0000 | ORAL_TABLET | Freq: Every day | ORAL | 3 refills | Status: DC

## 2019-03-19 NOTE — Telephone Encounter (Signed)
Call made to pharmacy to cancel rx-per pt's request, rx should go to [facility] .Despina Hidden Cassady6/12/202012:03 PM

## 2019-03-19 NOTE — Patient Instructions (Addendum)
It was nice seeing you today. Thank you for choosing [facility] for your Primary Care.   I will call or send you a message on MyChart with the results of your pap smear.     FOLLOW-UP INSTRUCTIONS When: 1 year with PCP, call sooner if needed For: birth control  What to bring:   Please contact the clinic if you have any problems, or need to be seen sooner.   Ethinyl Estradiol; Norgestimate tablets What is this medicine? ETHINYL ESTRADIOL; NORGESTIMATE (ETH in il es tra DYE ole; nor JES ti mate) is an oral contraceptive. The products combine two types of female hormones, an estrogen and a progestin. They are used to prevent ovulation and pregnancy. Some products are also used to treat acne in females. This medicine may be used for other purposes; ask your health care provider or pharmacist if you have questions. COMMON BRAND NAME(S): Estarylla, Mili, MONO-LINYAH, MonoNessa, Norgestimate/Ethinyl Estradiol, Ortho Tri-Cyclen, Ortho Tri-Cyclen Lo, Ortho-Cyclen, Previfem, Sprintec, Tri-Estarylla, TRI-LINYAH, Tri-Lo-Estarylla, Tri-Lo-Marzia, Tri-Lo-Mili, Tri-Lo-Sprintec, Tri-Mili, Tri-Previfem, Tri-Sprintec, Tri-VyLibra, Trinessa, Trinessa Lo, VyLibra What should I tell my health care provider before I take this medicine? They need to know if you have or ever had any of these conditions: -abnormal vaginal bleeding -blood vessel disease or blood clots -breast, cervical, endometrial, ovarian, liver, or uterine cancer -diabetes -gallbladder disease -heart disease or recent heart attack -high blood pressure -high cholesterol -kidney disease -liver disease -migraine headaches -stroke -systemic lupus erythematosus (SLE) -tobacco smoker -an unusual or allergic reaction to estrogens, progestins, other medicines, foods, dyes, or preservatives -pregnant or trying to get pregnant -breast-feeding How should I use this medicine? Take this medicine by mouth. To reduce nausea, this medicine  may be taken with food. Follow the directions on the prescription label. Take this medicine at the same time each day and in the order directed on the package. Do not take your medicine more often than directed. Contact your pediatrician regarding the use of this medicine in children. Special care may be needed. This medicine has been used in female children who have started having menstrual periods. A patient package insert for the product will be given with each prescription and refill. Read this sheet carefully each time. The sheet may change frequently. Overdosage: If you think you have taken too much of this medicine contact a poison control center or emergency room at once. NOTE: This medicine is only for you. Do not share this medicine with others. What if I miss a dose? If you miss a dose, refer to the patient information sheet you received with your medicine for direction. If you miss more than one pill, this medicine may not be as effective and you may need to use another form of birth control. What may interact with this medicine? Do not take this medicine with the following medication: -dasabuvir; ombitasvir; paritaprevir; ritonavir -ombitasvir; paritaprevir; ritonavir This medicine may also interact with the following medications: -acetaminophen -antibiotics or medicines for infections, especially rifampin, rifabutin, rifapentine, and griseofulvin, and possibly penicillins or tetracyclines -aprepitant -ascorbic acid (vitamin C) -atorvastatin -barbiturate medicines, such as phenobarbital -bosentan -carbamazepine -caffeine -clofibrate -cyclosporine -dantrolene -doxercalciferol -felbamate -grapefruit juice -hydrocortisone -medicines for anxiety or sleeping problems, such as diazepam or temazepam -medicines for diabetes, including pioglitazone -mineral oil -modafinil -mycophenolate -nefazodone -oxcarbazepine -phenytoin -prednisolone -ritonavir or other medicines for HIV  infection or AIDS -rosuvastatin -selegiline -soy isoflavones supplements -St. John's wort -tamoxifen or raloxifene -theophylline -thyroid hormones -topiramate -warfarin This list may not describe all possible interactions.  Give your health care provider a list of all the medicines, herbs, non-prescription drugs, or dietary supplements you use. Also tell them if you smoke, drink alcohol, or use illegal drugs. Some items may interact with your medicine. What should I watch for while using this medicine? Visit your doctor or health care professional for regular checks on your progress. You will need a regular breast and pelvic exam and Pap smear while on this medicine. You should also discuss the need for regular mammograms with your health care professional, and follow his or her guidelines for these tests. This medicine can make your body retain fluid, making your fingers, hands, or ankles swell. Your blood pressure can go up. Contact your doctor or health care professional if you feel you are retaining fluid. Use an additional method of contraception during the first cycle that you take these tablets. If you have any reason to think you are pregnant, stop taking this medicine right away and contact your doctor or health care professional. If you are taking this medicine for hormone related problems, it may take several cycles of use to see improvement in your condition. Do not use this product if you smoke and are over 30 years of age. Smoking increases the risk of getting a blood clot or having a stroke while you are taking birth control pills, especially if you are more than 26 years old. If you are a smoker who is 75 years of age or younger, you are strongly advised not to smoke while taking birth control pills. This medicine can make you more sensitive to the sun. Keep out of the sun. If you cannot avoid being in the sun, wear protective clothing and use sunscreen. Do not use sun lamps or  tanning beds/booths. If you wear contact lenses and notice visual changes, or if the lenses begin to feel uncomfortable, consult your eye care specialist. In some women, tenderness, swelling, or minor bleeding of the gums may occur. Notify your dentist if this happens. Brushing and flossing your teeth regularly may help limit this. See your dentist regularly and inform your dentist of the medicines you are taking. If you are going to have elective surgery, you may need to stop taking this medicine before the surgery. Consult your health care professional for advice. This medicine does not protect you against HIV infection (AIDS) or any other sexually transmitted diseases. What side effects may I notice from receiving this medicine? Side effects that you should report to your doctor or health care professional as soon as possible: -breast tissue changes or discharge -changes in vaginal bleeding during your period or between your periods -chest pain -coughing up blood -dizziness or fainting spells -headaches or migraines -leg, arm or groin pain -severe or sudden headaches -stomach pain (severe) -sudden shortness of breath -sudden loss of coordination, especially on one side of the body -speech problems -symptoms of vaginal infection like itching, irritation or unusual discharge -tenderness in the upper abdomen -vomiting -weakness or numbness in the arms or legs, especially on one side of the body -yellowing of the eyes or skin Side effects that usually do not require medical attention (report to your doctor or health care professional if they continue or are bothersome): -breakthrough bleeding and spotting that continues beyond the 3 initial cycles of pills -breast tenderness -mood changes, anxiety, depression, frustration, anger, or emotional outbursts -increased sensitivity to sun or ultraviolet light -nausea -skin rash, acne, or brown spots on the skin -weight gain (slight) This list  may not describe all possible side effects. Call your doctor for medical advice about side effects. You may report side effects to FDA at 1-800-FDA-1088. Where should I keep my medicine? Keep out of the reach of children. Store at room temperature between 15 and 30 degrees C (59 and 86 degrees F). Throw away any unused medicine after the expiration date. NOTE: This sheet is a summary. It may not cover all possible information. If you have questions about this medicine, talk to your doctor, pharmacist, or health care provider.  2019 Elsevier/Gold Standard (2016-06-03 08:09:09)   Contraception Choices Contraception, also called birth control, refers to methods or devices that prevent pregnancy. Hormonal methods Contraceptive implant  A contraceptive implant is a thin, plastic tube that contains a hormone. It is inserted into the upper part of the arm. It can remain in place for up to 3 years. Progestin-only injections Progestin-only injections are injections of progestin, a synthetic form of the hormone progesterone. They are given every 3 months by a health care provider. Birth control pills  Birth control pills are pills that contain hormones that prevent pregnancy. They must be taken once a day, preferably at the same time each day. Birth control patch  The birth control patch contains hormones that prevent pregnancy. It is placed on the skin and must be changed once a week for three weeks and removed on the fourth week. A prescription is needed to use this method of contraception. Vaginal ring  A vaginal ring contains hormones that prevent pregnancy. It is placed in the vagina for three weeks and removed on the fourth week. After that, the process is repeated with a new ring. A prescription is needed to use this method of contraception. Emergency contraceptive Emergency contraceptives prevent pregnancy after unprotected sex. They come in pill form and can be taken up to 5 days after sex.  They work best the sooner they are taken after having sex. Most emergency contraceptives are available without a prescription. This method should not be used as your only form of birth control. Barrier methods Female condom  A female condom is a thin sheath that is worn over the penis during sex. Condoms keep sperm from going inside a woman's body. They can be used with a spermicide to increase their effectiveness. They should be disposed after a single use. Female condom  A female condom is a soft, loose-fitting sheath that is put into the vagina before sex. The condom keeps sperm from going inside a woman's body. They should be disposed after a single use. Diaphragm  A diaphragm is a soft, dome-shaped barrier. It is inserted into the vagina before sex, along with a spermicide. The diaphragm blocks sperm from entering the uterus, and the spermicide kills sperm. A diaphragm should be left in the vagina for 6-8 hours after sex and removed within 24 hours. A diaphragm is prescribed and fitted by a health care provider. A diaphragm should be replaced every 1-2 years, after giving birth, after gaining more than 15 lb (6.8 kg), and after pelvic surgery. Cervical cap  A cervical cap is a round, soft latex or plastic cup that fits over the cervix. It is inserted into the vagina before sex, along with spermicide. It blocks sperm from entering the uterus. The cap should be left in place for 6-8 hours after sex and removed within 48 hours. A cervical cap must be prescribed and fitted by a health care provider. It should be replaced every 2 years. Sponge  A sponge is a soft, circular piece of polyurethane foam with spermicide on it. The sponge helps block sperm from entering the uterus, and the spermicide kills sperm. To use it, you make it wet and then insert it into the vagina. It should be inserted before sex, left in for at least 6 hours after sex, and removed and thrown away within 30  hours. Spermicides Spermicides are chemicals that kill or block sperm from entering the cervix and uterus. They can come as a cream, jelly, suppository, foam, or tablet. A spermicide should be inserted into the vagina with an applicator at least 32-67 minutes before sex to allow time for it to work. The process must be repeated every time you have sex. Spermicides do not require a prescription. Intrauterine contraception Intrauterine device (IUD) An IUD is a T-shaped device that is put in a woman's uterus. There are two types:  Hormone IUD.This type contains progestin, a synthetic form of the hormone progesterone. This type can stay in place for 3-5 years.  Copper IUD.This type is wrapped in copper wire. It can stay in place for 10 years.  Permanent methods of contraception Female tubal ligation In this method, a woman's fallopian tubes are sealed, tied, or blocked during surgery to prevent eggs from traveling to the uterus. Hysteroscopic sterilization In this method, a small, flexible insert is placed into each fallopian tube. The inserts cause scar tissue to form in the fallopian tubes and block them, so sperm cannot reach an egg. The procedure takes about 3 months to be effective. Another form of birth control must be used during those 3 months. Female sterilization This is a procedure to tie off the tubes that carry sperm (vasectomy). After the procedure, the man can still ejaculate fluid (semen). Natural planning methods Natural family planning In this method, a couple does not have sex on days when the woman could become pregnant. Calendar method This means keeping track of the length of each menstrual cycle, identifying the days when pregnancy can happen, and not having sex on those days. Ovulation method In this method, a couple avoids sex during ovulation. Symptothermal method This method involves not having sex during ovulation. The woman typically checks for ovulation by watching  changes in her temperature and in the consistency of cervical mucus. Post-ovulation method In this method, a couple waits to have sex until after ovulation. Summary  Contraception, also called birth control, means methods or devices that prevent pregnancy.  Hormonal methods of contraception include implants, injections, pills, patches, vaginal rings, and emergency contraceptives.  Barrier methods of contraception can include female condoms, female condoms, diaphragms, cervical caps, sponges, and spermicides.  There are two types of IUDs (intrauterine devices). An IUD can be put in a woman's uterus to prevent pregnancy for 3-5 years.  Permanent sterilization can be done through a procedure for males, females, or both.  Natural family planning methods involve not having sex on days when the woman could become pregnant. This information is not intended to replace advice given to you by your health care provider. Make sure you discuss any questions you have with your health care provider. Document Released: 09/23/2005 Document Revised: 09/25/2017 Document Reviewed: 10/26/2016 Elsevier Interactive Patient Education  2019 [employer].

## 2019-03-19 NOTE — Progress Notes (Signed)
   CC: pre-employment physical, pap smear  HPI:  [name] is a 28 y.o. F with no PMHx who presents for pre-employment physical and is also requesting a routine pap smear.   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 She denies prior hospitalizations or surgeries. She does not take any medications. Her first degree relatives are healthy, without any known medical issues.   She is not currently sexually active but is interested in starting birth control, specifically OCPs and states she is over due for a pap smear. She has had one previous pap smear and states that it was normal. She denies abnormal vaginal bleeding or discharge. Denies pelvic pain, dysuria, hematuria. She has had one pregnancy resulting in an abortion. Her LMP was 1 week ago. Her menstrual cycles are regular, occurring monthly, lasting 3-5 days and are light.   No past medical history on file. Review of Systems:  Review of Systems  Constitutional: Negative for fever, malaise/fatigue and weight loss.  HENT: Negative for sore throat.   Eyes: Negative for double vision.  Respiratory: Negative for cough, shortness of breath and wheezing.   Cardiovascular: Negative for chest pain and palpitations.  Gastrointestinal: Negative for abdominal pain and diarrhea.  Genitourinary: Negative for dysuria and hematuria.  Musculoskeletal: Negative for joint pain.  Neurological: Negative for dizziness and headaches.  Psychiatric/Behavioral: Negative for depression. The patient does not have insomnia.      Physical Exam:  Vitals:   03/19/19 1016  BP: (!) 96/51  Pulse: 84  Temp: 99.1 F (37.3 C)  TempSrc: Oral  SpO2: 100%  Weight: 149 lb 1.6 oz (67.6 kg)  Height: 5\' 9"  (1.753 m)   Gen: well appearing female  HENT: moist MM, good dentition, normal conjunctiva, no cervical LAD Cardiac: RRR, no m/r/g Pulm: CTAB Abd: soft, NT, ND GU: normal vaginal mucosa with large amount of  white discharge present in the vaginal vault, no foul odor. No CTM. Cervical os was visualized without discharge or friability.  Ext: warm, no LEE Neuro: CN 2-12 grossly intact, normal speech, no gait abnormality, strength 5/5 in all extremities   Assessment & Plan:   See Encounters Tab for problem based charting.  Patient discussed with Dr. Daryll Drown

## 2019-03-22 ENCOUNTER — Telehealth: Payer: Self-pay | Admitting: General Practice

## 2019-03-22 DIAGNOSIS — Z0000 Encounter for general adult medical examination without abnormal findings: Secondary | ICD-10-CM | POA: Insufficient documentation

## 2019-03-22 DIAGNOSIS — Z309 Encounter for contraceptive management, unspecified: Secondary | ICD-10-CM | POA: Insufficient documentation

## 2019-03-22 MED FILL — FEMYNOR 0.25-35 MG-MCG TABS: 0.25-35 | 28 days supply | Qty: 28 | Fill #0

## 2019-03-22 NOTE — Assessment & Plan Note (Signed)
Healthy female without any known medical problems requesting OCPs for primary contraception. Menstrual cycles are regular and not heavy. Denies significant cramping. Never smoker. Counseled on increased risk of blood clots, importance of abstaining from cigarette use, staying physically active. Given warning signs of blood clots: pleuritic chest pain, unilateral painful leg swelling.   - Rx Sprintec - f/u in 1 year

## 2019-03-22 NOTE — Telephone Encounter (Signed)
Return pt's call - stated her birth control pill rx needs to go to [facility].  Called [facility] - verbal order given to [facility] for norgestimate-ethinyl estradiol (ORTHO-CYCLEN) 0.25-35 MG-MCG tablet  Take 1 tab daily #3 package x 3 RF.

## 2019-03-22 NOTE — Telephone Encounter (Signed)
Pt would like a nurse call back regarding medicine [phone]

## 2019-03-22 NOTE — Assessment & Plan Note (Signed)
Due for pap smear. Denies history of abnormal pap. No current urogyn issues. GU exam unremarkable.   - pap smear performed today  - if normal, repeat pap smear in 3 years

## 2019-03-23 LAB — CYTOLOGY - PAP: Diagnosis: NEGATIVE

## 2019-03-23 NOTE — Progress Notes (Signed)
Internal Medicine Clinic Attending  Case discussed with Dr. Vogel  at the time of the visit.  We reviewed the resident's history and exam and pertinent patient test results.  I agree with the assessment, diagnosis, and plan of care documented in the resident's note.  

## 2019-03-29 ENCOUNTER — Encounter: Payer: Self-pay | Admitting: Internal Medicine

## 2019-04-20 MED FILL — FEMYNOR 0.25-35 MG-MCG TABS: 0.25-35 | 84 days supply | Qty: 84 | Fill #1

## 2019-07-14 LAB — HM DIABETES EYE EXAM

## 2019-08-03 ENCOUNTER — Other Ambulatory Visit (INDEPENDENT_AMBULATORY_CARE_PROVIDER_SITE_OTHER): Payer: No Typology Code available for payment source

## 2019-08-03 ENCOUNTER — Telehealth: Payer: Self-pay | Admitting: *Deleted

## 2019-08-03 ENCOUNTER — Other Ambulatory Visit: Payer: Self-pay

## 2019-08-03 ENCOUNTER — Other Ambulatory Visit: Payer: Self-pay | Admitting: Internal Medicine

## 2019-08-03 ENCOUNTER — Ambulatory Visit: Payer: No Typology Code available for payment source

## 2019-08-03 DIAGNOSIS — Z111 Encounter for screening for respiratory tuberculosis: Secondary | ICD-10-CM

## 2019-08-03 NOTE — Telephone Encounter (Signed)
If she does not want a TB skin test is that the [place] she wants?Marland Kitchen

## 2019-08-03 NOTE — Addendum Note (Signed)
Addended by: Truddie Crumble on: 08/03/2019 04:28 PM   Modules accepted: Orders

## 2019-08-03 NOTE — Telephone Encounter (Signed)
Pt needs labwork for TB requested by [facility] nursing program, could you put order in she has appt @1600  today, they will not accept a normal TB skin test. Have notified doriss. For assignment of pcp

## 2019-08-03 NOTE — Telephone Encounter (Signed)
Order in.

## 2019-08-03 NOTE — Telephone Encounter (Signed)
Yes, sorry, I should have stated that. Thanks

## 2019-08-06 LAB — QUANTIFERON-TB GOLD PLUS
QuantiFERON Mitogen Value: >10 IU/mL
QuantiFERON Nil Value: 0.02 IU/mL
QuantiFERON TB1 Ag Value: 0.05 IU/mL
QuantiFERON TB2 Ag Value: 0.04 IU/mL
QuantiFERON-TB Gold Plus: NEGATIVE

## 2020-04-05 IMAGING — CT CT HEAD W/O CM
4 series · 16 of 47 positions shown, 18 images · non-contrast
Comparison: None.

CLINICAL DATA: Pt was working on [employer] when she had a syncopal episode
and hit her head on the ground. Pt had LOC for about 1 minute per
witness. Pt was able to stand after she gained consciousness.
Complaining of bilateral hand numbness and Gotrieb sensation after
incident. Currently only feels numbness on right hand only. Pt
complaining of headache and also blurred vision.

EXAM:
CT HEAD WITHOUT CONTRAST
TECHNIQUE: Contiguous axial images were obtained from the base of the skull
through the vertex without intravenous contrast.

[Series 3: head without · axial · non-contrast · 0.44mm/px · z∈[-144,-24]mm · 7 of 33 slices shown, 9 images]
[im 5/33  brain]
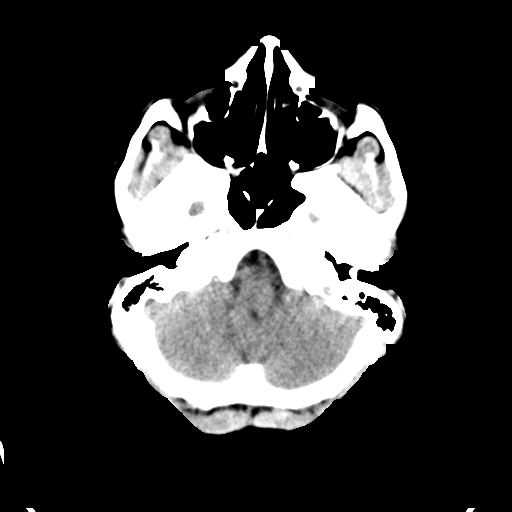
[im 5/33  bone]
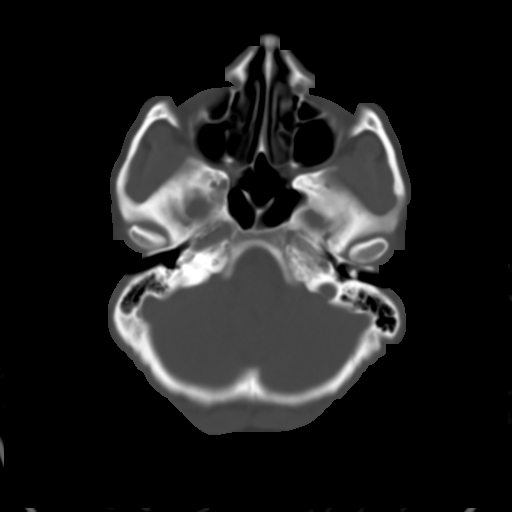
[im 9/33  brain]
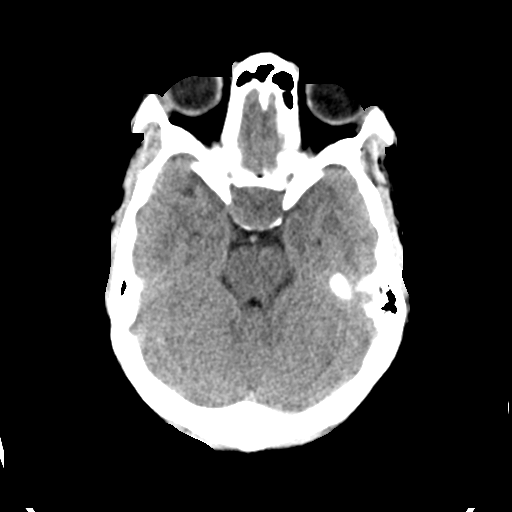
[im 13/33  brain]
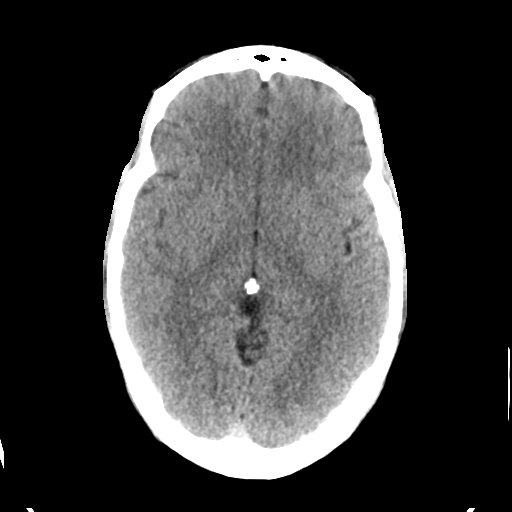
[im 17/33  brain]
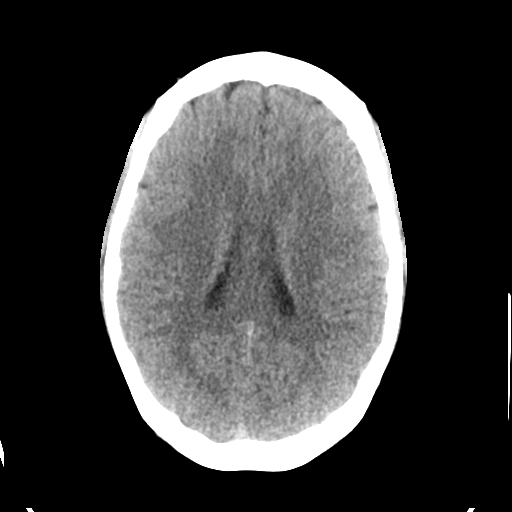
[im 21/33  brain]
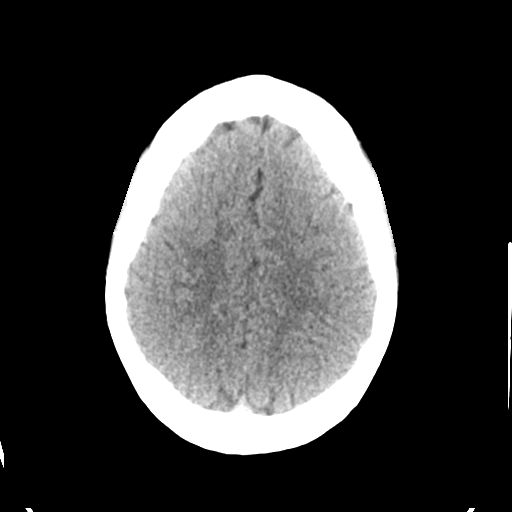
[im 21/33  bone]
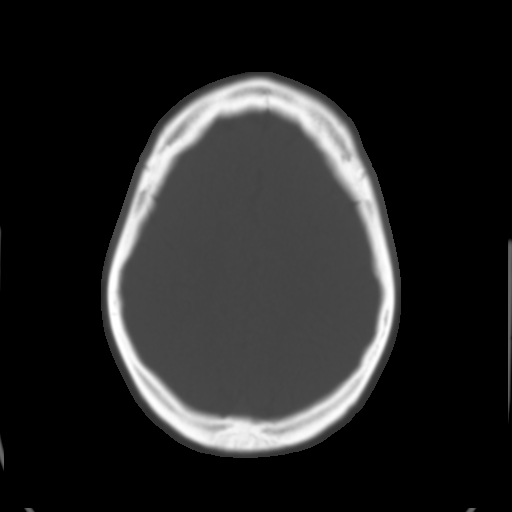
[im 25/33  brain]
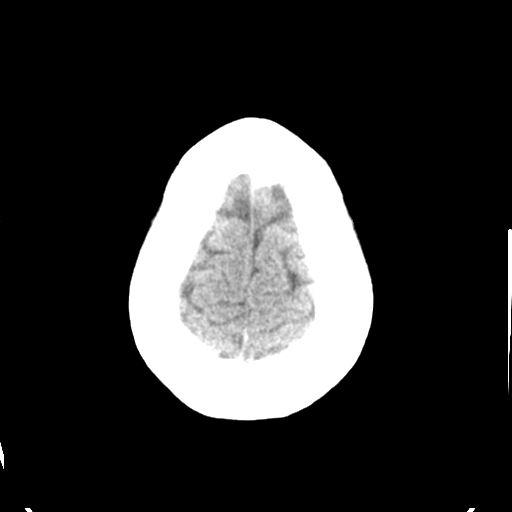
[im 29/33  brain]
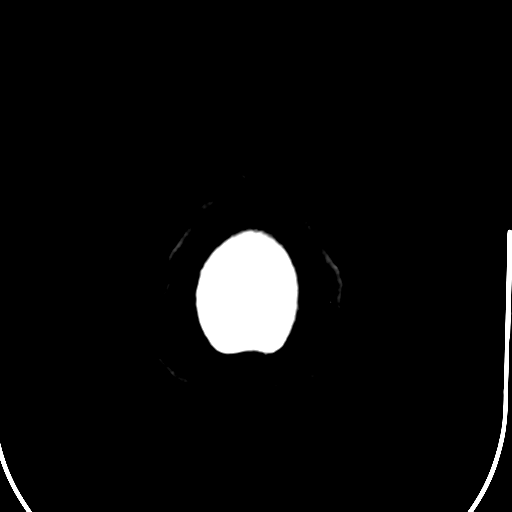

[Series 4: head bone · axial · 0.44mm/px · z∈[-148,-116]mm · 3 of 82 slices shown]
[im 9/82  bone]
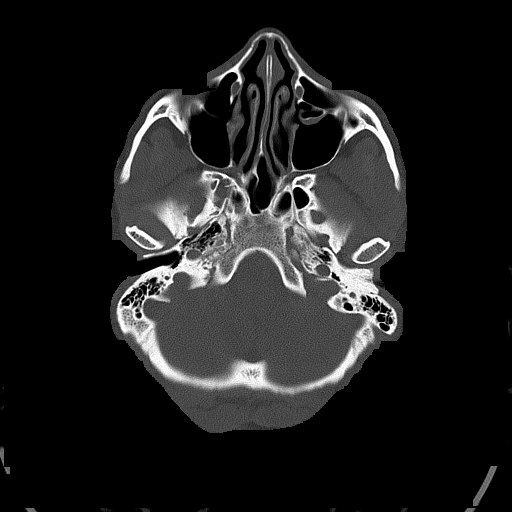
[im 17/82  bone]
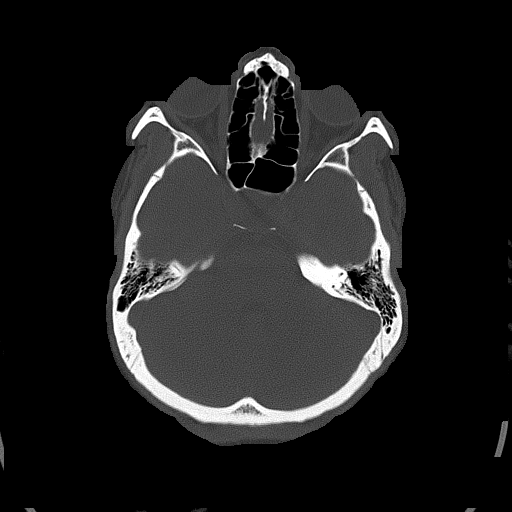
[im 25/82  bone]
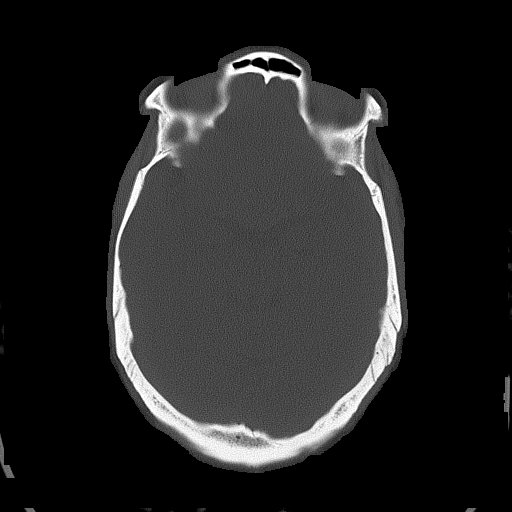

[Series 5: head without cor · coronal · non-contrast · 0.32mm/px · 3 of 67 slices shown]
[im 23/67  brain]
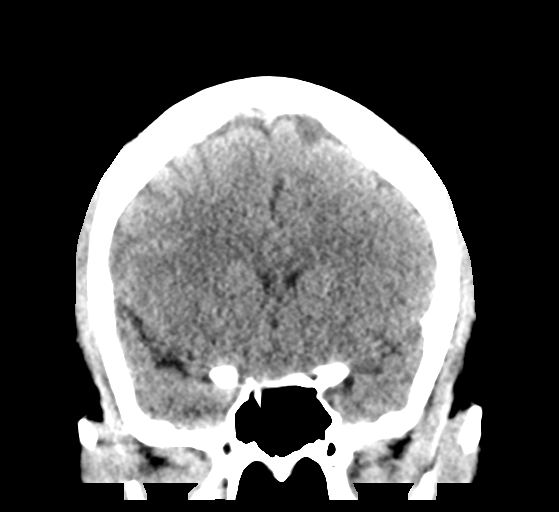
[im 30/67  brain]
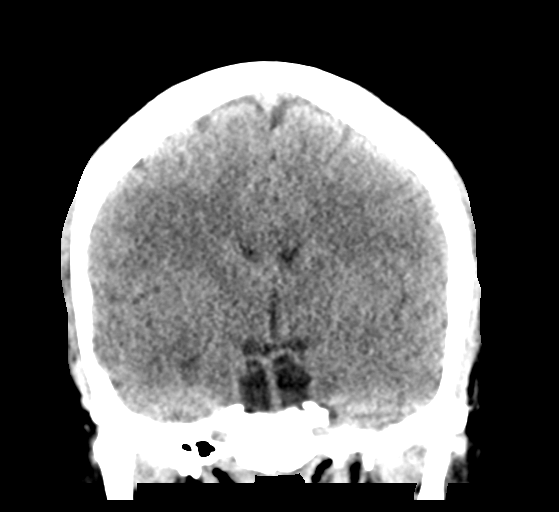
[im 37/67  brain]
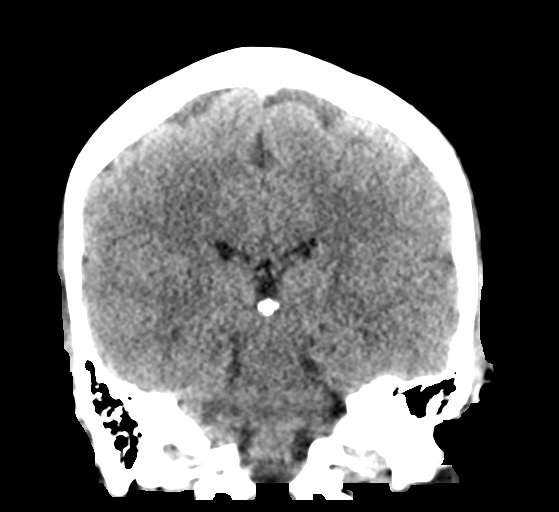

[Series 6: head without sag · sagittal · non-contrast · 0.32mm/px · 3 of 54 slices shown]
[im 18/54  brain]
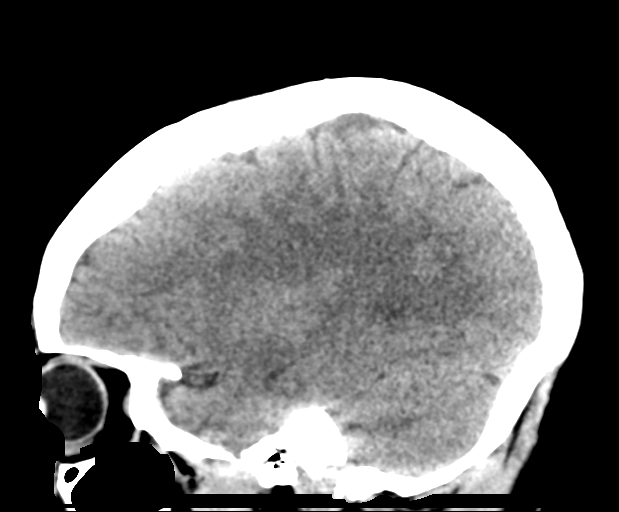
[im 27/54  brain]
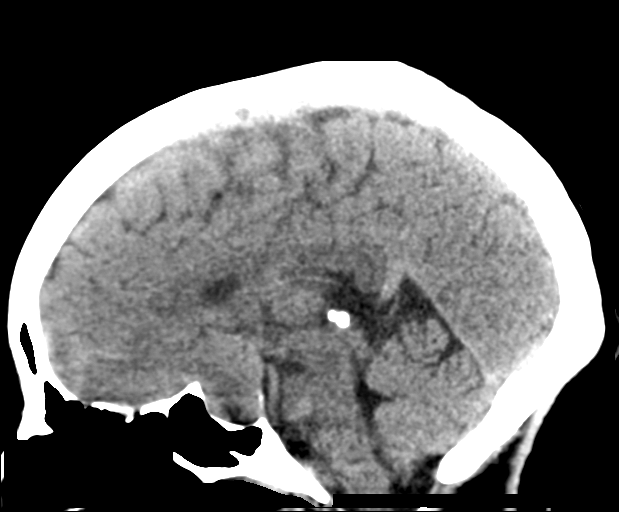
[im 36/54  brain]
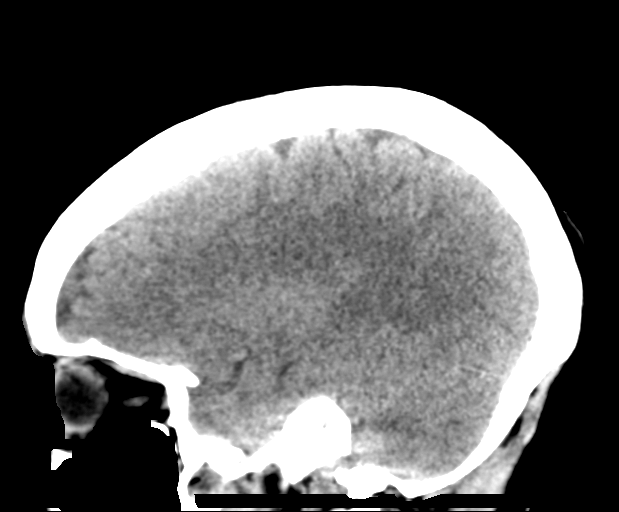

[16 of 47 positions shown; findings below may reference images not displayed]

FINDINGS: Brain: No evidence of acute infarction, hemorrhage, hydrocephalus,
extra-axial collection or mass lesion/mass effect.

Vascular: No hyperdense vessel or unexpected calcification.

Skull: Normal. Negative for fracture or focal lesion.

Sinuses/Orbits: Globes and orbits are within normal limits. The
visualized sinuses and mastoid air cells are clear.

Other: None.
IMPRESSION: Normal unenhanced CT scan of the brain.

## 2020-06-07 ENCOUNTER — Ambulatory Visit: Payer: No Typology Code available for payment source | Admitting: Neurology

## 2020-06-07 ENCOUNTER — Other Ambulatory Visit: Payer: Self-pay

## 2020-06-07 ENCOUNTER — Encounter: Payer: Self-pay | Admitting: Neurology

## 2020-06-07 ENCOUNTER — Telehealth: Payer: Self-pay | Admitting: Neurology

## 2020-06-07 VITALS — BP 105/65 | HR 84 | Ht 69.0 in | Wt 149.5 lb

## 2020-06-07 DIAGNOSIS — H5340 Unspecified visual field defects: Secondary | ICD-10-CM

## 2020-06-07 DIAGNOSIS — D352 Benign neoplasm of pituitary gland: Secondary | ICD-10-CM | POA: Insufficient documentation

## 2020-06-07 NOTE — Progress Notes (Signed)
[facility]  PATIENT: [name] DOB: 09-19-1991  REFERRING DOCTOR OR PCP: Gilles Chiquito, MD (PCP); [facility] SOURCE: Patient, notes from optometry, result from visual field testing  _________________________________   HISTORICAL  CHIEF COMPLAINT:  Chief Complaint  Patient presents with  . New Patient (Initial Visit)    RM 60 with mother. Paper referral from Darcey Nora, [place] at [facility] for sudden vision loss in left eye. She first noticed sx in April 2021. Can only see outlines/colors out of left eye.     HISTORY OF PRESENT ILLNESS:  I had the pleasure seeing your patient, [name], at [facility] for neurologic consultation regarding her visual loss.  She is a 29 year old woman who was noted to have reduced visual acuity, worse on the left side in April that was markedly worse in August.   She had noticed some difficulty with vision earlier 2021.  In April, she had a vacation in [place] and noticed that she had trouble finding the steps on staircases.     This progressed and she went back to see ophthalmology 05/29/2020.   She was found to have much worse vision bilaterally.  Additionally visual field changes with bitemporal hemianopsia out of either eye was noted  She feels she was last able to read out of her left eye early this year but was unable to in April.  Vision out of her right eye is fine for reading but she has reduced peripheral vision (right hemifield).     She denies any eye pain but gets headaches if she does a lot of computer work.    Symptoms are only visual.    She denies weakness, numbness or poor balance.  Gait is fine in bright lights but she does worse in dim lights.    She notes no change in bladder function.    She denies difficulty with fatigue, sleep, cognition or mood.     She has no recent pregnancy.  No galactorrhea.    No change in her menstrual cycle.    REVIEW OF  SYSTEMS: Constitutional: No fevers, chills, sweats, or change in appetite Eyes: No visual changes, double vision, eye pain Ear, nose and throat: No hearing loss, ear pain, nasal congestion, sore throat Cardiovascular: No chest pain, palpitations Respiratory: No shortness of breath at rest or with exertion.   No wheezes GastrointestinaI: No nausea, vomiting, diarrhea, abdominal pain, fecal incontinence Genitourinary: No dysuria, urinary retention or frequency.  No nocturia. Musculoskeletal: No neck pain, back pain Integumentary: No rash, pruritus, skin lesions Neurological: as above Psychiatric: No depression at this time.  No anxiety Endocrine: No palpitations, diaphoresis, change in appetite, change in weigh or increased thirst Hematologic/Lymphatic: No anemia, purpura, petechiae. Allergic/Immunologic: No itchy/runny eyes, nasal congestion, recent allergic reactions, rashes  ALLERGIES: No Known Allergies  HOME MEDICATIONS:  Current Outpatient Medications:  .  norgestimate-ethinyl estradiol (ORTHO-CYCLEN) 0.25-35 MG-MCG tablet, Take 1 tablet by mouth daily., Disp: 3 Package, Rfl: 3  PAST MEDICAL HISTORY: History reviewed. No pertinent past medical history.  PAST SURGICAL HISTORY: Past Surgical History:  Procedure Laterality Date  . NO PAST SURGERIES      FAMILY HISTORY: History reviewed. No pertinent family history.  SOCIAL HISTORY:  Social History   Socioeconomic History  . Marital status: Single    Spouse name: Not on file  . Number of children: Not on file  . Years of education: Not on file  . Highest education level:  Not on file  Occupational History  . Not on file  Tobacco Use  . Smoking status: Never Smoker  . Smokeless tobacco: Never Used  Substance and Sexual Activity  . Alcohol use: Yes    Comment: occ.  . Drug use: No  . Sexual activity: Yes    Birth control/protection: Condom  Other Topics Concern  . Not on file  Social History Narrative    Right handed    Lives alone   Caffeine use: sometimes (mostly coffee)   Social Determinants of Health   Financial Resource Strain:   . Difficulty of Paying Living Expenses: Not on file  Food Insecurity:   . Worried About Charity fundraiser in the Last Year: Not on file  . Ran Out of Food in the Last Year: Not on file  Transportation Needs:   . Lack of Transportation (Medical): Not on file  . Lack of Transportation (Non-Medical): Not on file  Physical Activity:   . Days of Exercise per Week: Not on file  . Minutes of Exercise per Session: Not on file  Stress:   . Feeling of Stress : Not on file  Social Connections:   . Frequency of Communication with Friends and Family: Not on file  . Frequency of Social Gatherings with Friends and Family: Not on file  . Attends Religious Services: Not on file  . Active Member of Clubs or Organizations: Not on file  . Attends Archivist Meetings: Not on file  . Marital Status: Not on file  Intimate Partner Violence:   . Fear of Current or Ex-Partner: Not on file  . Emotionally Abused: Not on file  . Physically Abused: Not on file  . Sexually Abused: Not on file     PHYSICAL EXAM  Vitals:   06/07/20 1043  BP: 105/65  Pulse: 84  SpO2: 95%  Weight: 149 lb 8 oz (67.8 kg)  Height: 5\' 9"  (1.753 m)    Body mass index is 22.08 kg/m.   General: The patient is well-developed and well-nourished and in no acute distress  HEENT:  Head is [place]/AT.  Sclera are anicteric.    Eyes:   She is able to read OD with central vision or nasal visual field and can count fingers OS (nasal visual field) but has little to no vision in there right eye temporal visual field and no vision with the left eye temporal visual field.   funduscopic exam shows normal optic discs and retinal vessels.  Neck: No carotid bruits are noted.  The neck is nontender.  Cardiovascular: The heart has a regular rate and rhythm with a normal S1 and S2. There were no  murmurs, gallops or rubs.    Skin: Extremities are without rash or  edema.  Musculoskeletal:  Back is nontender  Neurologic Exam  Mental status: The patient is alert and oriented x 3 at the time of the examination. The patient has apparent normal recent and remote memory, with an apparently normal attention span and concentration ability.   Speech is normal.  Cranial nerves: Extraocular movements are full. Pupils are equal, round, and reactive to light and accomodation.    Facial symmetry is present. There is good facial sensation to soft touch bilaterally.Facial strength is normal.  Trapezius and sternocleidomastoid strength is normal. No dysarthria is noted.  The tongue is midline, and the patient has symmetric elevation of the soft palate. No obvious hearing deficits are noted.  Motor:  Muscle bulk is normal.  Tone is normal. Strength is  5 / 5 in all 4 extremities.   Sensory: Sensory testing is intact to pinprick, soft touch and vibration sensation in all 4 extremities.  Coordination: Cerebellar testing reveals good finger-nose-finger and heel-to-shin bilaterally.  Gait and station: Station is normal.   Gait is normal. Tandem gait is normal. Romberg is negative.   Reflexes: Deep tendon reflexes are symmetric and normal bilaterally.   Plantar responses are flexor.    DIAGNOSTIC DATA (LABS, IMAGING, TESTING) - I reviewed patient records, labs, notes, testing and imaging myself where available.  Lab Results  Component Value Date   WBC 8.8 11/06/2018   HGB 13.7 11/06/2018   HCT 42.2 11/06/2018   MCV 81.5 11/06/2018   PLT 316 11/06/2018      Component Value Date/Time   NA 138 11/06/2018 2112   K 4.1 11/06/2018 2112   CL 104 11/06/2018 2112   CO2 19 (L) 11/06/2018 2112   GLUCOSE 94 11/06/2018 2112   BUN 18 11/06/2018 2112   CREATININE 1.10 (H) 11/06/2018 2112   CALCIUM 10.3 11/06/2018 2112   PROT 7.0 03/12/2014 1608   ALBUMIN 3.8 03/12/2014 1608   AST 16 03/12/2014 1608    ALT 12 03/12/2014 1608   ALKPHOS 43 03/12/2014 1608   BILITOT 0.6 03/12/2014 1608   GFRNONAA >60 11/06/2018 2112   GFRAA >60 11/06/2018 2112       ASSESSMENT AND PLAN  Visual field defect - Plan: MR BRAIN W WO CONTRAST   In summary, Ms. Minichiello is a 29 year old woman who has had progressive bitemporal visual field loss, with further nasal visual loss on the left over the past 6 months.  She does not note any pain and has no other neurologic symptom.  Besides the visual field defect, she had a normal neurologic examination.  I discussed with her and her mother that I am most concerned about the possibility of a pituitary macroadenoma distorting the optic chiasm.  Because of her age, MS is also a possibility, though I feel the likelihood is less.  We will check an MRI of the brain with and without contrast to further evaluate.  We were able to get this scheduled for tomorrow.  If she does have a macroadenoma, I will refer to neurosurgery.  We will let her know the results of the MRI.  I will see her back based on the results or if she has significant neurologic changes.  Thank you for asking me to see Ms. Luhrs.  Please let me know if I can be of further assistance with her or other patients in the future.   Deiondre Harrower A. Felecia Shelling, MD, [facility] 01/07/3294, 18:84 AM Certified in Neurology, Clinical Neurophysiology, Sleep Medicine and Neuroimaging  [facility] [address] [place], [place] [address] [phone]

## 2020-06-07 NOTE — Telephone Encounter (Signed)
[employer] auth: [id number] (exp. 06/08/20 to 07/08/20). I emailed Olin Hauser the manager at [facility] and they will reach out to the patient to schedule.

## 2020-06-07 NOTE — Telephone Encounter (Signed)
FYI Dr. Felecia Shelling  Patient is scheduled at [facility] for 06/08/20

## 2020-06-08 ENCOUNTER — Telehealth: Payer: Self-pay | Admitting: Neurology

## 2020-06-08 ENCOUNTER — Telehealth: Payer: Self-pay | Admitting: Internal Medicine

## 2020-06-08 ENCOUNTER — Ambulatory Visit
Admission: RE | Admit: 2020-06-08 | Discharge: 2020-06-08 | Disposition: A | Discharge status: Home / Self Care | Payer: No Typology Code available for payment source | Source: Ambulatory Visit | Attending: Neurology | Admitting: Neurology

## 2020-06-08 DIAGNOSIS — H5340 Unspecified visual field defects: Secondary | ICD-10-CM | POA: Diagnosis not present

## 2020-06-08 MED ORDER — GADOBENATE DIMEGLUMINE 529 MG/ML IV SOLN
14.0000 mL | Freq: Once | INTRAVENOUS | Status: AC | PRN
  Administered 2020-06-08: 14 mL via INTRAVENOUS

## 2020-06-08 NOTE — Telephone Encounter (Signed)
Needs note for work, suggesting to have MRI and then ask dr sater if not call [facility] back, she is agreeable

## 2020-06-08 NOTE — Telephone Encounter (Signed)
Dr. Felecia Shelling- are you able to provide this letter? I see she has her MRI today.

## 2020-06-08 NOTE — Telephone Encounter (Signed)
Pt ask for a note for employer stating pt is able to work and document pt had a doctor appt on 06/07/20. Pt would like a call from the nurse.

## 2020-06-08 NOTE — Telephone Encounter (Signed)
Pls contact pt regarding [phone]

## 2020-06-09 ENCOUNTER — Encounter: Payer: Self-pay | Admitting: Neurology

## 2020-06-10 ENCOUNTER — Other Ambulatory Visit: Payer: Self-pay | Admitting: Neurology

## 2020-06-10 DIAGNOSIS — D352 Benign neoplasm of pituitary gland: Secondary | ICD-10-CM

## 2020-06-10 DIAGNOSIS — H5340 Unspecified visual field defects: Secondary | ICD-10-CM

## 2020-06-14 ENCOUNTER — Encounter: Payer: Self-pay | Admitting: Internal Medicine

## 2020-06-15 ENCOUNTER — Other Ambulatory Visit: Payer: Self-pay | Admitting: Neurosurgery

## 2020-06-16 ENCOUNTER — Telehealth: Payer: Self-pay

## 2020-06-16 NOTE — Telephone Encounter (Signed)
Spoke with pt today regarding FMLA, pt states [facility] will be the one to completed the form for her.

## 2020-06-20 ENCOUNTER — Ambulatory Visit (INDEPENDENT_AMBULATORY_CARE_PROVIDER_SITE_OTHER): Payer: No Typology Code available for payment source | Admitting: Internal Medicine

## 2020-06-20 ENCOUNTER — Encounter: Payer: Self-pay | Admitting: Internal Medicine

## 2020-06-20 ENCOUNTER — Other Ambulatory Visit: Payer: Self-pay

## 2020-06-20 DIAGNOSIS — D352 Benign neoplasm of pituitary gland: Secondary | ICD-10-CM

## 2020-06-20 NOTE — Progress Notes (Signed)
   CC: vision changes  HPI:  [name] is a 29 y.o. with PMH as below.   Please see A&P for assessment of the patient's acute and chronic medical conditions.   She started having decreased vision about 5 months ago that has progressively worsened. She went to see ophthalmology in August and was found to have bitemporal hemianopsia of both eyes and decreased vision bilaterally, quite severe in the left eye. She states she can really only see outlines on the left and in her right eye she is completely unable to see the right peripherally. 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    No past medical history on file. Review of Systems:   Review of Systems  Constitutional: Negative for chills, fever and malaise/fatigue.  Eyes: Positive for blurred vision. Negative for photophobia, pain, discharge and redness.       Cannot see out of periphery on right in right eye, can only see outlines and light from left eye  Respiratory: Negative for cough and shortness of breath.   Gastrointestinal: Negative for nausea and vomiting.  Neurological: Negative for dizziness, tingling, sensory change and headaches.    Physical Exam:   Constitution: tearful, appears stated age HENT: [place]/AT Eyes: no injection or icterus, right eye without peripheral vision shortly past the mid visual field, left eye unable to read or count fingers, eom intact Cardio: RRR, no LE edema  MSK: moving all extremities, strength symmetrical UE & LE bilaterally Neuro: normal affect, a&ox3 Skin: c/d/i    Vitals:   06/20/20 1535  BP: 117/75  Pulse: 82  SpO2: 100%  Weight: 153 lb 4.8 oz (69.5 kg)      Assessment & Plan:   See Encounters Tab for problem based charting.  Patient discussed with Dr. Daryll Drown

## 2020-06-20 NOTE — Telephone Encounter (Signed)
Gave completed/signed form back to medical records to process for pt. 

## 2020-06-20 NOTE — Patient Instructions (Signed)
We will submit your paper-work for work leave. Please follow-up with the clinic for your yearly appointment in one month.

## 2020-06-21 ENCOUNTER — Ambulatory Visit (INDEPENDENT_AMBULATORY_CARE_PROVIDER_SITE_OTHER): Payer: No Typology Code available for payment source | Admitting: Otolaryngology

## 2020-06-21 ENCOUNTER — Telehealth: Payer: Self-pay | Admitting: *Deleted

## 2020-06-21 ENCOUNTER — Encounter (INDEPENDENT_AMBULATORY_CARE_PROVIDER_SITE_OTHER): Payer: Self-pay | Admitting: Otolaryngology

## 2020-06-21 VITALS — Temp 97.2°F

## 2020-06-21 DIAGNOSIS — D352 Benign neoplasm of pituitary gland: Secondary | ICD-10-CM

## 2020-06-21 NOTE — Progress Notes (Signed)
HPI: [name] is a 29 y.o. female who presents is referred by Dr. Arnoldo Morale for evaluation of preoperative evaluation for transsphenoidal resection of pituitary tumor. Patient apparently has had some visual problems for several years but have become much worse since April.  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  She was subsequently referred to Dr. Arnoldo Morale for surgical intervention. She is otherwise healthy.. She has no nasal sinus complaints.  No past medical history on file. Past Surgical History:  Procedure Laterality Date  . NO PAST SURGERIES     Social History   Socioeconomic History  . Marital status: Single    Spouse name: Not on file  . Number of children: Not on file  . Years of education: Not on file  . Highest education level: Not on file  Occupational History  . Not on file  Tobacco Use  . Smoking status: Never Smoker  . Smokeless tobacco: Never Used  Substance and Sexual Activity  . Alcohol use: Yes    Comment: occ.  . Drug use: No  . Sexual activity: Yes    Birth control/protection: Condom  Other Topics Concern  . Not on file  Social History Narrative   Right handed    Lives alone   Caffeine use: sometimes (mostly coffee)   Social Determinants of Health   Financial Resource Strain:   . Difficulty of Paying Living Expenses: Not on file  Food Insecurity:   . Worried About Charity fundraiser in the Last Year: Not on file  . Ran Out of Food in the Last Year: Not on file  Transportation Needs:   . Lack of Transportation (Medical): Not on file  . Lack of Transportation (Non-Medical): Not on file  Physical Activity:   . Days of Exercise per Week: Not on file  . Minutes of Exercise per Session: Not on file  Stress:   . Feeling of Stress : Not on file  Social Connections:   . Frequency of Communication with Friends and Family: Not on file  . Frequency of Social  Gatherings with Friends and Family: Not on file  . Attends Religious Services: Not on file  . Active Member of Clubs or Organizations: Not on file  . Attends Archivist Meetings: Not on file  . Marital Status: Not on file   No family history on file. No Known Allergies Prior to Admission medications   Medication Sig Start Date End Date Taking? Authorizing Provider  norgestimate-ethinyl estradiol (ORTHO-CYCLEN) 0.25-35 MG-MCG tablet Take 1 tablet by mouth daily. Patient not taking: Reported on 06/16/2020 03/19/19   Isabelle Course, MD     Positive ROS: Otherwise negative  All other systems have been reviewed and were otherwise negative with the exception of those mentioned in the HPI and as above.  Physical Exam: Constitutional: Alert, well-appearing, no acute distress Ears: External ears without lesions or tenderness. Ear canals are clear bilaterally with intact, clear TMs.  Nasal: External nose without lesions. Septum is midline. Clear nasal passages bilaterally. Oral: Lips and gums without lesions. Tongue and palate mucosa without lesions. Posterior oropharynx clear. Neck: No palpable adenopathy or masses Respiratory: Breathing comfortably  Skin: No facial/neck lesions or rash noted.  Procedures  Assessment: Pituitary macroadenoma with secondary visual changes.  Plan: Reviewed the surgical intervention with the patient in the office today and answered any questions. Discussed the approach with her and the  necessity of nasal packing for 2 days postoperatively. She is tentatively scheduled for 1 week on Wednesday.  Discussed with her that I will plan on removing the packing Friday morning.   Radene Journey, MD   CC:

## 2020-06-21 NOTE — Assessment & Plan Note (Addendum)
She started having decreased vision about 5 months ago that has progressively worsened. She went to see ophthalmology in August and was found to have bitemporal hemianopsia of both eyes and decreased vision bilaterally, quite severe in the left eye. She states she can really only see outlines on the left and in her right eye she is completely unable to see the right peripherally. 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  She was seen by neurosurgery 9/1. Imaging was done and showed pituitary macroadenoma. They will plan for transphenoidal biopsy later this month.   - f/u in one month to assess program post surgery and for wellness check  - fmla paperwork filled out

## 2020-06-21 NOTE — Telephone Encounter (Signed)
Pt FMLA form faxed.

## 2020-06-22 ENCOUNTER — Other Ambulatory Visit: Payer: Self-pay | Admitting: Neurosurgery

## 2020-06-22 NOTE — Progress Notes (Signed)
Internal Medicine Clinic Attending  Case discussed with Dr. Seawell  At the time of the visit.  We reviewed the resident's history and exam and pertinent patient test results.  I agree with the assessment, diagnosis, and plan of care documented in the resident's note.  

## 2020-06-23 NOTE — Progress Notes (Signed)
[place], [place] - [address] [facility]. [address] [place] [address] Phone: [phone] Fax: [phone]      Your procedure is scheduled on Wednesday 06/28/2020.  Report to [facility] Main Entrance "A" at 10:00 A.M., and check in at the Admitting office.  Call this number if you have problems the morning of surgery:  [phone]  Call [phone] if you have any questions prior to your surgery date Monday-Friday 8am-4pm    Remember:  Do not eat or drink after midnight the night before your surgery    Do not take any medications the morning of surgery.    As of today, STOP taking any Aspirin (unless otherwise instructed by your surgeon) Aleve, Naproxen, Ibuprofen, Motrin, Advil, Goody's, BC's, all herbal medications, fish oil, and all vitamins.                      Do not wear jewelry, make up, or nail polish            Do not wear lotions, powders, perfumes, or deodorant.            Do not shave 48 hours prior to surgery.            Do not bring valuables to the hospital.            [facility] is not responsible for any belongings or valuables.  Do NOT Smoke (Tobacco/Vaping) or drink Alcohol 24 hours prior to your procedure  If you use a CPAP at night, you may bring all equipment for your overnight stay.   Contacts, glasses, dentures or bridgework may not be worn into surgery.      For patients admitted to the hospital, discharge time will be determined by your treatment team.   Patients discharged the day of surgery will not be allowed to drive home, and someone needs to stay with them for 24 hours.    Special instructions:   [facility]- Preparing For Surgery  Before surgery, you can play an important role. Because skin is not sterile, your skin needs to be as free of germs as possible. You can reduce the number of germs on your skin by washing with CHG (chlorahexidine gluconate) Soap before surgery.  CHG is an  antiseptic cleaner which kills germs and bonds with the skin to continue killing germs even after washing.    Oral Hygiene is also important to reduce your risk of infection.  Remember - BRUSH YOUR TEETH THE MORNING OF SURGERY WITH YOUR REGULAR TOOTHPASTE  Please do not use if you have an allergy to CHG or antibacterial soaps. If your skin becomes reddened/irritated stop using the CHG.  Do not shave (including legs and underarms) for at least 48 hours prior to first CHG shower. It is OK to shave your face.  Please follow these instructions carefully.   1. Shower the NIGHT BEFORE SURGERY and the MORNING OF SURGERY with CHG Soap.   2. If you chose to wash your hair, wash your hair first as usual with your normal shampoo.  3. After you shampoo, rinse your hair and body thoroughly to remove the shampoo.  4. Use CHG as you would any other liquid soap. You can apply CHG directly to the skin and wash gently with a scrungie or a clean washcloth.   5. Apply the CHG Soap to your body ONLY FROM THE NECK DOWN.  Do not use on open  wounds or open sores. Avoid contact with your eyes, ears, mouth and genitals (private parts). Wash Face and genitals (private parts)  with your normal soap.   6. Wash thoroughly, paying special attention to the area where your surgery will be performed.  7. Thoroughly rinse your body with warm water from the neck down.  8. DO NOT shower/wash with your normal soap after using and rinsing off the CHG Soap.  9. Pat yourself dry with a CLEAN TOWEL.  10. Wear CLEAN PAJAMAS to bed the night before surgery  11. Place CLEAN SHEETS on your bed the night of your first shower and DO NOT SLEEP WITH PETS.   Day of Surgery: Shower with CHG soap as directed Wear Clean/Comfortable clothing the morning of surgery Do not apply any deodorants/lotions.   Remember to brush your teeth WITH YOUR REGULAR TOOTHPASTE.   Please read over the following fact sheets that you were  given.

## 2020-06-26 ENCOUNTER — Encounter (HOSPITAL_COMMUNITY)
Admission: RE | Admit: 2020-06-26 | Discharge: 2020-06-26 | Disposition: A | Discharge status: Home / Self Care | Payer: No Typology Code available for payment source | Source: Ambulatory Visit | Attending: Neurosurgery | Admitting: Neurosurgery

## 2020-06-26 ENCOUNTER — Other Ambulatory Visit (HOSPITAL_COMMUNITY): Payer: No Typology Code available for payment source

## 2020-06-26 ENCOUNTER — Other Ambulatory Visit: Payer: Self-pay

## 2020-06-26 ENCOUNTER — Ambulatory Visit (INDEPENDENT_AMBULATORY_CARE_PROVIDER_SITE_OTHER): Payer: Self-pay | Admitting: Otolaryngology

## 2020-06-26 ENCOUNTER — Encounter (HOSPITAL_COMMUNITY): Payer: Self-pay

## 2020-06-26 DIAGNOSIS — Z01812 Encounter for preprocedural laboratory examination: Secondary | ICD-10-CM | POA: Insufficient documentation

## 2020-06-26 DIAGNOSIS — D497 Neoplasm of unspecified behavior of endocrine glands and other parts of nervous system: Secondary | ICD-10-CM

## 2020-06-26 LAB — CBC
HCT: 39.3 % (ref 36.0–46.0)
Hemoglobin: 12.2 g/dL (ref 12.0–15.0)
MCH: 26.2 pg (ref 26.0–34.0)
MCHC: 31 g/dL (ref 30.0–36.0)
MCV: 84.5 fL (ref 80.0–100.0)
Platelets: 291 10*3/uL (ref 150–400)
RBC: 4.65 MIL/uL (ref 3.87–5.11)
RDW: 15.3 % (ref 11.5–15.5)
WBC: 7.3 10*3/uL (ref 4.0–10.5)
nRBC: 0 % (ref 0.0–0.2)

## 2020-06-26 NOTE — H&P (Signed)
PREOPERATIVE H&P  Chief Complaint: Visual changes secondary to pituitary tumor  HPI: [name] is a 29 y.o. 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  Patient has noted some visual changes over the past couple of months and she underwent a MRI scan that showed a large pituitary tumor pressing on the optic chiasm.  She is taken to the operating room at this time for transsphenoidal resection of pituitary tumor.  No past medical history on file. Past Surgical History:  Procedure Laterality Date  . NO PAST SURGERIES     Social History   Socioeconomic History  . Marital status: Single    Spouse name: Not on file  . Number of children: Not on file  . Years of education: Not on file  . Highest education level: Not on file  Occupational History  . Not on file  Tobacco Use  . Smoking status: Never Smoker  . Smokeless tobacco: Never Used  Vaping Use  . Vaping Use: Never used  Substance and Sexual Activity  . Alcohol use: Yes    Comment: sociall  . Drug use: No  . Sexual activity: Yes    Birth control/protection: Condom  Other Topics Concern  . Not on file  Social History Narrative   Right handed    Lives alone   Caffeine use: sometimes (mostly coffee)   Social Determinants of Health   Financial Resource Strain:   . Difficulty of Paying Living Expenses: Not on file  Food Insecurity:   . Worried About Charity fundraiser in the Last Year: Not on file  . Ran Out of Food in the Last Year: Not on file  Transportation Needs:   . Lack of Transportation (Medical): Not on file  . Lack of Transportation (Non-Medical): Not on file  Physical Activity:   . Days of Exercise per Week: Not on file  . Minutes of Exercise per Session: Not on file  Stress:   . Feeling of Stress : Not on file  Social Connections:   .  Frequency of Communication with Friends and Family: Not on file  . Frequency of Social Gatherings with Friends and Family: Not on file  . Attends Religious Services: Not on file  . Active Member of Clubs or Organizations: Not on file  . Attends Archivist Meetings: Not on file  . Marital Status: Not on file   No family history on file. No Known Allergies Prior to Admission medications   Medication Sig Start Date End Date Taking? Authorizing Provider  norgestimate-ethinyl estradiol (ORTHO-CYCLEN) 0.25-35 MG-MCG tablet Take 1 tablet by mouth daily. Patient not taking: Reported on 06/16/2020 03/19/19   Isabelle Course, MD     Positive ROS: Otherwise negative  All other systems have been reviewed and were otherwise negative with the exception of those mentioned in the HPI and as above.  Physical Exam: There were no vitals filed for this visit.  General: Alert, no acute distress Oral: Normal oral mucosa and tonsils Nasal: Clear nasal passages with straight nasal septum and mild rhinitis.  No intranasal masses noted. Neck: No palpable adenopathy or thyroid nodules Ear: Ear canal is clear with normal appearing TMs Cardiovascular: Regular rate and rhythm, no murmur.  Respiratory: Clear to auscultation Neurologic: Alert and oriented x 3   Assessment/Plan: Pituitary tumor  Plan for transsphenoidal resection of pituitary tumor.  Melony Overly, MD 06/26/2020 9:20 AM

## 2020-06-26 NOTE — H&P (View-Only) (Signed)
PREOPERATIVE H&P  Chief Complaint: Visual changes secondary to pituitary tumor  HPI: [name] is a 29 y.o. 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  Patient has noted some visual changes over the past couple of months and she underwent a MRI scan that showed a large pituitary tumor pressing on the optic chiasm.  She is taken to the operating room at this time for transsphenoidal resection of pituitary tumor.  No past medical history on file. Past Surgical History:  Procedure Laterality Date  . NO PAST SURGERIES     Social History   Socioeconomic History  . Marital status: Single    Spouse name: Not on file  . Number of children: Not on file  . Years of education: Not on file  . Highest education level: Not on file  Occupational History  . Not on file  Tobacco Use  . Smoking status: Never Smoker  . Smokeless tobacco: Never Used  Vaping Use  . Vaping Use: Never used  Substance and Sexual Activity  . Alcohol use: Yes    Comment: sociall  . Drug use: No  . Sexual activity: Yes    Birth control/protection: Condom  Other Topics Concern  . Not on file  Social History Narrative   Right handed    Lives alone   Caffeine use: sometimes (mostly coffee)   Social Determinants of Health   Financial Resource Strain:   . Difficulty of Paying Living Expenses: Not on file  Food Insecurity:   . Worried About Charity fundraiser in the Last Year: Not on file  . Ran Out of Food in the Last Year: Not on file  Transportation Needs:   . Lack of Transportation (Medical): Not on file  . Lack of Transportation (Non-Medical): Not on file  Physical Activity:   . Days of Exercise per Week: Not on file  . Minutes of Exercise per Session: Not on file  Stress:   . Feeling of Stress : Not on file  Social Connections:   .  Frequency of Communication with Friends and Family: Not on file  . Frequency of Social Gatherings with Friends and Family: Not on file  . Attends Religious Services: Not on file  . Active Member of Clubs or Organizations: Not on file  . Attends Archivist Meetings: Not on file  . Marital Status: Not on file   No family history on file. No Known Allergies Prior to Admission medications   Medication Sig Start Date End Date Taking? Authorizing Provider  norgestimate-ethinyl estradiol (ORTHO-CYCLEN) 0.25-35 MG-MCG tablet Take 1 tablet by mouth daily. Patient not taking: Reported on 06/16/2020 03/19/19   Isabelle Course, MD     Positive ROS: Otherwise negative  All other systems have been reviewed and were otherwise negative with the exception of those mentioned in the HPI and as above.  Physical Exam: There were no vitals filed for this visit.  General: Alert, no acute distress Oral: Normal oral mucosa and tonsils Nasal: Clear nasal passages with straight nasal septum and mild rhinitis.  No intranasal masses noted. Neck: No palpable adenopathy or thyroid nodules Ear: Ear canal is clear with normal appearing TMs Cardiovascular: Regular rate and rhythm, no murmur.  Respiratory: Clear to auscultation Neurologic: Alert and oriented x 3   Assessment/Plan: Pituitary tumor  Plan for transsphenoidal resection of pituitary tumor.  Melony Overly, MD 06/26/2020 9:20 AM

## 2020-06-26 NOTE — Progress Notes (Signed)
PCP - Gilles Chiquito Cardiologist - denies  Chest x-ray - n/a EKG - n/a Stress Test - denies ECHO - denies Cardiac Cath - denies   COVID TEST- 06/26/20   Anesthesia review: NO  Patient denies shortness of breath, fever, cough and chest pain at PAT appointment   All instructions explained to the patient, with a verbal understanding of the material. Patient agrees to go over the instructions while at home for a better understanding. Patient also instructed to self quarantine after being tested for COVID-19. The opportunity to ask questions was provided.

## 2020-06-27 ENCOUNTER — Other Ambulatory Visit (HOSPITAL_COMMUNITY)
Admission: RE | Admit: 2020-06-27 | Discharge: 2020-06-27 | Disposition: A | Discharge status: Home / Self Care | Payer: No Typology Code available for payment source | Source: Ambulatory Visit | Attending: Neurosurgery | Admitting: Neurosurgery

## 2020-06-27 DIAGNOSIS — Z20822 Contact with and (suspected) exposure to covid-19: Secondary | ICD-10-CM | POA: Insufficient documentation

## 2020-06-27 DIAGNOSIS — Z01812 Encounter for preprocedural laboratory examination: Secondary | ICD-10-CM | POA: Insufficient documentation

## 2020-06-27 LAB — SARS CORONAVIRUS 2 BY RT PCR (HOSPITAL ORDER, PERFORMED IN ~~LOC~~ HOSPITAL LAB): SARS Coronavirus 2: NEGATIVE

## 2020-06-28 ENCOUNTER — Encounter (HOSPITAL_COMMUNITY)
Admission: RE | Disposition: A | Discharge status: Home / Self Care | Payer: Self-pay | Source: Home / Self Care | Attending: Neurosurgery

## 2020-06-28 ENCOUNTER — Other Ambulatory Visit: Payer: Self-pay

## 2020-06-28 ENCOUNTER — Inpatient Hospital Stay (HOSPITAL_COMMUNITY)
Admission: RE | Admit: 2020-06-28 | Discharge: 2020-07-01 | DRG: 615 | Disposition: A | Discharge status: Home / Self Care | Payer: No Typology Code available for payment source | Attending: Neurosurgery | Admitting: Neurosurgery

## 2020-06-28 ENCOUNTER — Inpatient Hospital Stay (HOSPITAL_COMMUNITY): Payer: No Typology Code available for payment source | Admitting: Anesthesiology

## 2020-06-28 ENCOUNTER — Encounter (HOSPITAL_COMMUNITY): Payer: Self-pay | Admitting: Neurosurgery

## 2020-06-28 ENCOUNTER — Inpatient Hospital Stay (HOSPITAL_COMMUNITY): Payer: No Typology Code available for payment source | Admitting: Physician Assistant

## 2020-06-28 DIAGNOSIS — D497 Neoplasm of unspecified behavior of endocrine glands and other parts of nervous system: Secondary | ICD-10-CM | POA: Diagnosis present

## 2020-06-28 DIAGNOSIS — D352 Benign neoplasm of pituitary gland: Secondary | ICD-10-CM | POA: Diagnosis present

## 2020-06-28 DIAGNOSIS — Z20822 Contact with and (suspected) exposure to covid-19: Secondary | ICD-10-CM | POA: Diagnosis present

## 2020-06-28 HISTORY — PX: TRANSNASAL APPROACH: SHX6149

## 2020-06-28 HISTORY — PX: CRANIOTOMY: SHX93

## 2020-06-28 LAB — CREATININE, SERUM
Creatinine, Ser: 0.76 mg/dL (ref 0.44–1.00)
GFR calc Af Amer: >60 mL/min (ref >60)
GFR calc non Af Amer: >60 mL/min (ref >60)

## 2020-06-28 LAB — POCT PREGNANCY, URINE: Preg Test, Ur: NEGATIVE

## 2020-06-28 SURGERY — CRANIOTOMY HYPOPHYSECTOMY TRANSNASAL APPROACH
Anesthesia: General | Site: Nose

## 2020-06-28 MED ORDER — THROMBIN 5000 UNITS EX SOLR
CUTANEOUS | Status: AC
  Filled 2020-06-28: qty 10000

## 2020-06-28 MED ORDER — POTASSIUM CHLORIDE IN NACL 20-0.9 MEQ/L-% IV SOLN
INTRAVENOUS | Status: DC
  Filled 2020-06-28 (×2): qty 1000

## 2020-06-28 MED ORDER — CHLORHEXIDINE GLUCONATE CLOTH 2 % EX PADS: 6.0000 | MEDICATED_PAD | Freq: Once | CUTANEOUS | Status: DC

## 2020-06-28 MED ORDER — LIDOCAINE-EPINEPHRINE 1 %-1:100000 IJ SOLN
INTRAMUSCULAR | Status: AC
  Filled 2020-06-28: qty 1

## 2020-06-28 MED ORDER — LABETALOL HCL 5 MG/ML IV SOLN: 10.0000 mg | INTRAVENOUS | Status: DC | PRN

## 2020-06-28 MED ORDER — DOCUSATE SODIUM 100 MG PO CAPS
100.0000 mg | ORAL_CAPSULE | Freq: Two times a day (BID) | ORAL | Status: DC
  Filled 2020-06-28 (×4): qty 1

## 2020-06-28 MED ORDER — CHLORHEXIDINE GLUCONATE 0.12 % MT SOLN
OROMUCOSAL | Status: AC
  Administered 2020-06-28: 15 mL via OROMUCOSAL
  Filled 2020-06-28: qty 15

## 2020-06-28 MED ORDER — ONDANSETRON HCL 4 MG/2ML IJ SOLN
INTRAMUSCULAR | Status: DC | PRN
  Administered 2020-06-28: 4 mg via INTRAVENOUS

## 2020-06-28 MED ORDER — THROMBIN 5000 UNITS EX SOLR
CUTANEOUS | Status: DC | PRN
  Administered 2020-06-28 (×2): 5000 [IU] via TOPICAL

## 2020-06-28 MED ORDER — ONDANSETRON HCL 4 MG/2ML IJ SOLN
INTRAMUSCULAR | Status: AC
  Filled 2020-06-28: qty 2

## 2020-06-28 MED ORDER — MIDAZOLAM HCL 5 MG/5ML IJ SOLN
INTRAMUSCULAR | Status: DC | PRN
  Administered 2020-06-28: 2 mg via INTRAVENOUS

## 2020-06-28 MED ORDER — CEFAZOLIN SODIUM-DEXTROSE 2-4 GM/100ML-% IV SOLN: 2.0000 g | INTRAVENOUS | Status: DC

## 2020-06-28 MED ORDER — SUCCINYLCHOLINE CHLORIDE 20 MG/ML IJ SOLN
INTRAMUSCULAR | Status: DC | PRN
  Administered 2020-06-28: 40 mg via INTRAVENOUS

## 2020-06-28 MED ORDER — FENTANYL CITRATE (PF) 100 MCG/2ML IJ SOLN
INTRAMUSCULAR | Status: DC | PRN
Start: 2020-06-28 — End: 2020-06-28
  Administered 2020-06-28 (×2): 50 ug via INTRAVENOUS

## 2020-06-28 MED ORDER — CEFAZOLIN SODIUM-DEXTROSE 2-4 GM/100ML-% IV SOLN
INTRAVENOUS | Status: AC
  Filled 2020-06-28: qty 100

## 2020-06-28 MED ORDER — MORPHINE SULFATE (PF) 2 MG/ML IV SOLN
2.0000 mg | INTRAVENOUS | Status: DC | PRN
  Administered 2020-06-28: 2 mg via INTRAVENOUS
  Filled 2020-06-28: qty 1

## 2020-06-28 MED ORDER — CEFAZOLIN SODIUM-DEXTROSE 2-4 GM/100ML-% IV SOLN
2.0000 g | INTRAVENOUS | Status: AC
  Administered 2020-06-28: 2 g via INTRAVENOUS

## 2020-06-28 MED ORDER — MUPIROCIN 2 % EX OINT
TOPICAL_OINTMENT | CUTANEOUS | Status: AC
  Filled 2020-06-28: qty 22

## 2020-06-28 MED ORDER — DEXMEDETOMIDINE (PRECEDEX) IN NS 20 MCG/5ML (4 MCG/ML) IV SYRINGE
PREFILLED_SYRINGE | INTRAVENOUS | Status: DC | PRN
  Administered 2020-06-28: 8 ug via INTRAVENOUS

## 2020-06-28 MED ORDER — HYDROCODONE-ACETAMINOPHEN 5-325 MG PO TABS
1.0000 | ORAL_TABLET | ORAL | Status: DC | PRN
  Administered 2020-06-29 – 2020-07-01 (×8): 1 via ORAL
  Filled 2020-06-28 (×8): qty 1

## 2020-06-28 MED ORDER — OXYMETAZOLINE HCL 0.05 % NA SOLN
NASAL | Status: AC
  Filled 2020-06-28: qty 30

## 2020-06-28 MED ORDER — ACETAMINOPHEN 325 MG PO TABS
650.0000 mg | ORAL_TABLET | ORAL | Status: DC | PRN
  Administered 2020-06-28: 650 mg via ORAL
  Filled 2020-06-28: qty 2

## 2020-06-28 MED ORDER — BACITRACIN ZINC 500 UNIT/GM EX OINT
TOPICAL_OINTMENT | CUTANEOUS | Status: AC
  Filled 2020-06-28: qty 28.35

## 2020-06-28 MED ORDER — DEXAMETHASONE SODIUM PHOSPHATE 10 MG/ML IJ SOLN
INTRAMUSCULAR | Status: DC | PRN
  Administered 2020-06-28: 10 mg via INTRAVENOUS

## 2020-06-28 MED ORDER — THROMBIN 5000 UNITS EX SOLR: OROMUCOSAL | Status: DC | PRN

## 2020-06-28 MED ORDER — MUPIROCIN 2 % EX OINT
TOPICAL_OINTMENT | CUTANEOUS | Status: DC | PRN
  Administered 2020-06-28: 1 via TOPICAL

## 2020-06-28 MED ORDER — PROMETHAZINE HCL 25 MG PO TABS: 12.5000 mg | ORAL_TABLET | ORAL | Status: DC | PRN

## 2020-06-28 MED ORDER — PROPOFOL 10 MG/ML IV BOLUS
INTRAVENOUS | Status: AC
  Filled 2020-06-28: qty 20

## 2020-06-28 MED ORDER — LIDOCAINE 2% (20 MG/ML) 5 ML SYRINGE
INTRAMUSCULAR | Status: DC | PRN
  Administered 2020-06-28: 60 mg via INTRAVENOUS

## 2020-06-28 MED ORDER — CHLORHEXIDINE GLUCONATE CLOTH 2 % EX PADS
6.0000 | MEDICATED_PAD | Freq: Once | CUTANEOUS | Status: DC
  Administered 2020-06-28: 6 via TOPICAL

## 2020-06-28 MED ORDER — BACITRACIN ZINC 500 UNIT/GM EX OINT
TOPICAL_OINTMENT | CUTANEOUS | Status: DC | PRN
  Administered 2020-06-28 (×2): 1 via TOPICAL

## 2020-06-28 MED ORDER — THROMBIN 5000 UNITS EX SOLR
CUTANEOUS | Status: AC
  Filled 2020-06-28: qty 5000

## 2020-06-28 MED ORDER — ONDANSETRON HCL 4 MG/2ML IJ SOLN: 4.0000 mg | INTRAMUSCULAR | Status: DC | PRN

## 2020-06-28 MED ORDER — ACETAMINOPHEN 500 MG PO TABS
1000.0000 mg | ORAL_TABLET | Freq: Once | ORAL | Status: AC
  Administered 2020-06-28: 1000 mg via ORAL
  Filled 2020-06-28: qty 2

## 2020-06-28 MED ORDER — CEFAZOLIN SODIUM-DEXTROSE 2-4 GM/100ML-% IV SOLN
2.0000 g | Freq: Three times a day (TID) | INTRAVENOUS | Status: DC
  Administered 2020-06-28 – 2020-06-30 (×5): 2 g via INTRAVENOUS
  Filled 2020-06-28 (×5): qty 100

## 2020-06-28 MED ORDER — ROCURONIUM BROMIDE 10 MG/ML (PF) SYRINGE
PREFILLED_SYRINGE | INTRAVENOUS | Status: DC | PRN
  Administered 2020-06-28: 30 mg via INTRAVENOUS
  Administered 2020-06-28: 40 mg via INTRAVENOUS
  Administered 2020-06-28: 60 mg via INTRAVENOUS

## 2020-06-28 MED ORDER — LACTATED RINGERS IV SOLN: INTRAVENOUS | Status: DC | PRN

## 2020-06-28 MED ORDER — HEMOSTATIC AGENTS (NO CHARGE) OPTIME
TOPICAL | Status: DC | PRN
  Administered 2020-06-28: 1 via TOPICAL

## 2020-06-28 MED ORDER — OXYMETAZOLINE HCL 0.05 % NA SOLN
NASAL | Status: DC | PRN
  Administered 2020-06-28: 1 via TOPICAL

## 2020-06-28 MED ORDER — MIDAZOLAM HCL 2 MG/2ML IJ SOLN
INTRAMUSCULAR | Status: AC
  Filled 2020-06-28: qty 2

## 2020-06-28 MED ORDER — HYDROMORPHONE HCL 1 MG/ML IJ SOLN: 0.2500 mg | INTRAMUSCULAR | Status: DC | PRN

## 2020-06-28 MED ORDER — PHENYLEPHRINE HCL-NACL 10-0.9 MG/250ML-% IV SOLN
INTRAVENOUS | Status: DC | PRN
  Administered 2020-06-28: 40 ug/min via INTRAVENOUS

## 2020-06-28 MED ORDER — ONDANSETRON HCL 4 MG PO TABS
4.0000 mg | ORAL_TABLET | ORAL | Status: DC | PRN
  Administered 2020-06-30: 4 mg via ORAL
  Filled 2020-06-28: qty 1

## 2020-06-28 MED ORDER — ACETAMINOPHEN 650 MG RE SUPP: 650.0000 mg | RECTAL | Status: DC | PRN

## 2020-06-28 MED ORDER — LACTATED RINGERS IV SOLN: INTRAVENOUS | Status: DC

## 2020-06-28 MED ORDER — FENTANYL CITRATE (PF) 250 MCG/5ML IJ SOLN
INTRAMUSCULAR | Status: AC
Start: 2020-06-28 — End: ?
  Filled 2020-06-28: qty 5

## 2020-06-28 MED ORDER — CHLORHEXIDINE GLUCONATE CLOTH 2 % EX PADS
6.0000 | MEDICATED_PAD | Freq: Every day | CUTANEOUS | Status: DC
  Administered 2020-06-29 – 2020-06-30 (×2): 6 via TOPICAL

## 2020-06-28 MED ORDER — SODIUM CHLORIDE 0.9 % IV SOLN
0.0500 ug/kg/min | INTRAVENOUS | Status: DC
  Administered 2020-06-28: .1 ug/kg/min via INTRAVENOUS
  Filled 2020-06-28 (×2): qty 5000

## 2020-06-28 MED ORDER — PANTOPRAZOLE SODIUM 40 MG IV SOLR
40.0000 mg | Freq: Every day | INTRAVENOUS | Status: DC
  Administered 2020-06-28 – 2020-06-30 (×3): 40 mg via INTRAVENOUS
  Filled 2020-06-28 (×3): qty 40

## 2020-06-28 MED ORDER — POLYETHYLENE GLYCOL 3350 17 G PO PACK: 17.0000 g | PACK | Freq: Every day | ORAL | Status: DC | PRN

## 2020-06-28 MED ORDER — ORAL CARE MOUTH RINSE: 15.0000 mL | Freq: Once | OROMUCOSAL | Status: AC

## 2020-06-28 MED ORDER — LIDOCAINE-EPINEPHRINE 1 %-1:100000 IJ SOLN
INTRAMUSCULAR | Status: DC | PRN
  Administered 2020-06-28: 10 mL
  Administered 2020-06-28: 9 mL

## 2020-06-28 MED ORDER — SUGAMMADEX SODIUM 200 MG/2ML IV SOLN
INTRAVENOUS | Status: DC | PRN
  Administered 2020-06-28: 300 mg via INTRAVENOUS

## 2020-06-28 MED ORDER — CHLORHEXIDINE GLUCONATE 0.12 % MT SOLN: 15.0000 mL | Freq: Once | OROMUCOSAL | Status: AC

## 2020-06-28 MED ORDER — PROPOFOL 10 MG/ML IV BOLUS
INTRAVENOUS | Status: DC | PRN
  Administered 2020-06-28: 50 mg via INTRAVENOUS
  Administered 2020-06-28: 150 mg via INTRAVENOUS
  Administered 2020-06-28: 50 mg via INTRAVENOUS

## 2020-06-28 MED ORDER — LIDOCAINE 2% (20 MG/ML) 5 ML SYRINGE
INTRAMUSCULAR | Status: AC
  Filled 2020-06-28: qty 5

## 2020-06-28 MED ORDER — METHYLENE BLUE 0.5 % INJ SOLN
INTRAVENOUS | Status: AC
  Filled 2020-06-28: qty 10

## 2020-06-28 MED ORDER — BUPIVACAINE-EPINEPHRINE 0.5% -1:200000 IJ SOLN
INTRAMUSCULAR | Status: AC
  Filled 2020-06-28: qty 1

## 2020-06-28 MED ORDER — BISACODYL 10 MG RE SUPP: 10.0000 mg | Freq: Every day | RECTAL | Status: DC | PRN

## 2020-06-28 SURGICAL SUPPLY — 115 items
BAND RUBBER #18 3X1/16 STRL (MISCELLANEOUS) ×6 IMPLANT
BENZOIN TINCTURE PRP APPL 2/3 (GAUZE/BANDAGES/DRESSINGS) ×3 IMPLANT
BIT DRILL NEURO 2X3.1 SFT TUCH (MISCELLANEOUS) IMPLANT
BLADE SURG 10 STRL SS (BLADE) ×1 IMPLANT
BLADE SURG 11 STRL SS (BLADE) ×4 IMPLANT
BLADE SURG 15 STRL LF DISP TIS (BLADE) ×1 IMPLANT
BLADE SURG 15 STRL SS (BLADE) ×2
BUR MATCHSTICK NEURO 3.0 LAGG (BURR) IMPLANT
CABLE BIPOLOR RESECTION CORD (MISCELLANEOUS) ×1 IMPLANT
CANISTER SUCT 3000ML PPV (MISCELLANEOUS) ×4 IMPLANT
CARTRIDGE OIL MAESTRO DRILL (MISCELLANEOUS) ×1 IMPLANT
CATH ROBINSON RED A/P 14FR (CATHETERS) IMPLANT
CLEANER TIP ELECTROSURG 2X2 (MISCELLANEOUS) IMPLANT
CLOSURE WOUND 1/2 X4 (GAUZE/BANDAGES/DRESSINGS) ×1
COAGULATOR SUCT 8FR VV (MISCELLANEOUS) IMPLANT
CONT SPEC 4OZ CLIKSEAL STRL BL (MISCELLANEOUS) ×6 IMPLANT
COTTONBALL LRG STERILE PKG (GAUZE/BANDAGES/DRESSINGS) IMPLANT
COVER WAND RF STERILE (DRAPES) ×2 IMPLANT
DECANTER SPIKE VIAL GLASS SM (MISCELLANEOUS) ×3 IMPLANT
DIFFUSER DRILL AIR PNEUMATIC (MISCELLANEOUS) ×1 IMPLANT
DRAIN SUBARACHNOID (WOUND CARE) IMPLANT
DRAPE EENT ADH APERT 15X15 STR (DRAPES) ×2 IMPLANT
DRAPE HALF SHEET 40X57 (DRAPES) ×3 IMPLANT
DRAPE INCISE IOBAN 66X45 STRL (DRAPES) ×5 IMPLANT
DRAPE MICROSCOPE LEICA (MISCELLANEOUS) ×3 IMPLANT
DRAPE ORTHO SPLIT 77X108 STRL (DRAPES) ×2
DRAPE SURG ORHT 6 SPLT 77X108 (DRAPES) IMPLANT
DRESSING MEROCEL 8CM (GAUZE/BANDAGES/DRESSINGS) IMPLANT
DRESSING NASAL KENNEDY 3.5X.9 (MISCELLANEOUS) ×1 IMPLANT
DRESSING NASAL POPE 10X1.5X2.5 (GAUZE/BANDAGES/DRESSINGS) IMPLANT
DRILL NEURO 2X3.1 SOFT TOUCH (MISCELLANEOUS) ×3
DRSG MEROCEL 8CM (GAUZE/BANDAGES/DRESSINGS) ×6
DRSG NASAL KENNEDY 3.5X.9 (MISCELLANEOUS)
DRSG NASAL POPE 10X1.5X2.5 (GAUZE/BANDAGES/DRESSINGS) ×6
DRSG OPSITE POSTOP 3X4 (GAUZE/BANDAGES/DRESSINGS) ×2 IMPLANT
DRSG TELFA 3X8 NADH (GAUZE/BANDAGES/DRESSINGS) ×3 IMPLANT
ELECT CAUTERY BLADE 6.4 (BLADE) ×3 IMPLANT
ELECT COATED BLADE 2.86 ST (ELECTRODE) ×3 IMPLANT
ELECT NDL TIP 2.8 STRL (NEEDLE) ×1 IMPLANT
ELECT NEEDLE TIP 2.8 STRL (NEEDLE) ×3 IMPLANT
ELECT REM PT RETURN 9FT ADLT (ELECTROSURGICAL) ×3
ELECTRODE REM PT RTRN 9FT ADLT (ELECTROSURGICAL) ×1 IMPLANT
GAUZE PACKING FOLDED 2  STR (GAUZE/BANDAGES/DRESSINGS) ×2
GAUZE PACKING FOLDED 2 STR (GAUZE/BANDAGES/DRESSINGS) ×1 IMPLANT
GAUZE SPONGE 2X2 8PLY STRL LF (GAUZE/BANDAGES/DRESSINGS) ×1 IMPLANT
GAUZE SPONGE 4X4 12PLY STRL (GAUZE/BANDAGES/DRESSINGS) ×3 IMPLANT
GLOVE BIO SURGEON STRL SZ 6.5 (GLOVE) ×1 IMPLANT
GLOVE BIO SURGEON STRL SZ8 (GLOVE) ×3 IMPLANT
GLOVE BIO SURGEON STRL SZ8.5 (GLOVE) ×3 IMPLANT
GLOVE BIO SURGEONS STRL SZ 6.5 (GLOVE) ×1
GLOVE BIOGEL PI IND STRL 6.5 (GLOVE) IMPLANT
GLOVE BIOGEL PI IND STRL 7.0 (GLOVE) IMPLANT
GLOVE BIOGEL PI IND STRL 7.5 (GLOVE) IMPLANT
GLOVE BIOGEL PI INDICATOR 6.5 (GLOVE) ×2
GLOVE BIOGEL PI INDICATOR 7.0 (GLOVE) ×2
GLOVE BIOGEL PI INDICATOR 7.5 (GLOVE) ×2
GLOVE EXAM NITRILE XL STR (GLOVE) IMPLANT
GLOVE SS BIOGEL STRL SZ 7.5 (GLOVE) ×1 IMPLANT
GLOVE SUPERSENSE BIOGEL SZ 7.5 (GLOVE) ×4
GLOVE SURG SS PI 7.0 STRL IVOR (GLOVE) ×4 IMPLANT
GOWN STRL REUS W/ TWL LRG LVL3 (GOWN DISPOSABLE) ×2 IMPLANT
GOWN STRL REUS W/ TWL XL LVL3 (GOWN DISPOSABLE) ×1 IMPLANT
GOWN STRL REUS W/TWL LRG LVL3 (GOWN DISPOSABLE) ×2
GOWN STRL REUS W/TWL XL LVL3 (GOWN DISPOSABLE) ×6
HEMOSTAT POWDER KIT SURGIFOAM (HEMOSTASIS) ×3 IMPLANT
KIT BASIN OR (CUSTOM PROCEDURE TRAY) ×4 IMPLANT
KIT TURNOVER KIT B (KITS) ×4 IMPLANT
MARKER SKIN DUAL TIP RULER LAB (MISCELLANEOUS) ×3 IMPLANT
NDL HYPO 25GX1X1/2 BEV (NEEDLE) IMPLANT
NDL HYPO 25X1 1.5 SAFETY (NEEDLE) ×1 IMPLANT
NDL PRECISIONGLIDE 27X1.5 (NEEDLE) ×1 IMPLANT
NDL SPNL 22GX3.5 QUINCKE BK (NEEDLE) IMPLANT
NEEDLE HYPO 25GX1X1/2 BEV (NEEDLE) IMPLANT
NEEDLE HYPO 25X1 1.5 SAFETY (NEEDLE) IMPLANT
NEEDLE PRECISIONGLIDE 27X1.5 (NEEDLE) ×3 IMPLANT
NEEDLE SPNL 22GX3.5 QUINCKE BK (NEEDLE) IMPLANT
NS IRRIG 1000ML POUR BTL (IV SOLUTION) ×4 IMPLANT
OIL CARTRIDGE MAESTRO DRILL (MISCELLANEOUS)
PACK LAMINECTOMY NEURO (CUSTOM PROCEDURE TRAY) ×2 IMPLANT
PAD ARMBOARD 7.5X6 YLW CONV (MISCELLANEOUS) ×9 IMPLANT
PAD DRESSING TELFA 3X8 NADH (GAUZE/BANDAGES/DRESSINGS) ×1 IMPLANT
PATTIES SURGICAL .25X.25 (GAUZE/BANDAGES/DRESSINGS) ×1 IMPLANT
PATTIES SURGICAL .5 X.5 (GAUZE/BANDAGES/DRESSINGS) ×1 IMPLANT
PATTIES SURGICAL .5 X3 (DISPOSABLE) ×4 IMPLANT
PENCIL BUTTON HOLSTER BLD 10FT (ELECTRODE) ×1 IMPLANT
PENCIL FOOT CONTROL (ELECTRODE) IMPLANT
PIN MAYFIELD SKULL DISP (PIN) ×3 IMPLANT
SPLINT NASAL DOYLE BI-VL (GAUZE/BANDAGES/DRESSINGS) ×2 IMPLANT
SPONGE GAUZE 2X2 STER 10/PKG (GAUZE/BANDAGES/DRESSINGS)
SPONGE LAP 4X18 RFD (DISPOSABLE) ×1 IMPLANT
SPONGE SURGIFOAM ABS GEL SZ50 (HEMOSTASIS) ×2 IMPLANT
STAPLER SKIN PROX WIDE 3.9 (STAPLE) ×3 IMPLANT
STRIP CLOSURE SKIN 1/2X4 (GAUZE/BANDAGES/DRESSINGS) ×1 IMPLANT
SUT BONE WAX W31G (SUTURE) ×1 IMPLANT
SUT CHROMIC 3 0 PS 2 (SUTURE) IMPLANT
SUT CHROMIC 4 0 P 3 18 (SUTURE) ×2 IMPLANT
SUT CHROMIC 4 0 PS 2 18 (SUTURE) ×3 IMPLANT
SUT CHROMIC 5 0 P 3 (SUTURE) IMPLANT
SUT ETHILON 3 0 PS 1 (SUTURE) ×4 IMPLANT
SUT ETHILON 4 0 PS 2 18 (SUTURE) IMPLANT
SUT ETHILON 5 0 P 3 18 (SUTURE)
SUT NYLON ETHILON 5-0 P-3 1X18 (SUTURE) IMPLANT
SUT SILK 2 0 PERMA HAND 18 BK (SUTURE) ×1 IMPLANT
SUT VIC AB 2-0 CP2 18 (SUTURE) ×2 IMPLANT
SUT VIC AB 2-0 CT1 27 (SUTURE)
SUT VIC AB 2-0 CT1 27XBRD (SUTURE) IMPLANT
SUT VIC AB 3-0 SH 8-18 (SUTURE) IMPLANT
SYR 5ML LL (SYRINGE) IMPLANT
SYR CONTROL 10ML LL (SYRINGE) ×1 IMPLANT
TOWEL GREEN STERILE (TOWEL DISPOSABLE) ×3 IMPLANT
TOWEL GREEN STERILE FF (TOWEL DISPOSABLE) ×3 IMPLANT
TRAY ENT MC OR (CUSTOM PROCEDURE TRAY) ×2 IMPLANT
TRAY FOLEY MTR SLVR 16FR STAT (SET/KITS/TRAYS/PACK) ×3 IMPLANT
UNDERPAD 30X36 HEAVY ABSORB (UNDERPADS AND DIAPERS) ×3 IMPLANT
WATER STERILE IRR 1000ML POUR (IV SOLUTION) ×5 IMPLANT

## 2020-06-28 NOTE — Interval H&P Note (Signed)
History and Physical Interval Note:  06/28/2020 12:03 PM  [name]  has presented today for surgery, with the diagnosis of PITUITARY MACROADENOMA.  The various methods of treatment have been discussed with the patient and family. After consideration of risks, benefits and other options for treatment, the patient has consented to  Procedure(s): TRANSPHENOIDAL RESECTION OF TUMOR (N/A) t (N/A) as a surgical intervention.  The patient's history has been reviewed, patient examined, no change in status, stable for surgery.  I have reviewed the patient's chart and labs.  Questions were answered to the patient's satisfaction.     Melony Overly

## 2020-06-28 NOTE — Op Note (Signed)
[name], [name] MEDICAL RECORD [id number] ACCOUNT [id number] DATE OF BIRTH:May 16, 1991 FACILITY: [facility] LOCATION: [facility] PHYSICIAN:Louise Victory Lincoln Maxin, MD  OPERATIVE REPORT  DATE OF PROCEDURE:  06/28/2020  PREOPERATIVE DIAGNOSIS:  Pituitary macroadenoma with visual changes.  POSTOPERATIVE DIAGNOSIS:  Pituitary macroadenoma with visual changes.  OPERATION PERFORMED:  Transsphenoidal resection of pituitary tumor.  SURGEON:  Melony Overly, MD   CO-SURGEON:  Tamberlyn Midgley Pies, MD  ANESTHESIA:  General endotracheal.  ESTIMATED BLOOD LOSS:  Less  than 50 mL.  COMPLICATIONS:  None.  BRIEF CLINICAL NOTE:  The patient is a 29 year old female who has been having some visual changes over the past several months.  She underwent an MRI scan that showed a large pituitary macroadenoma.  She was referred to Dr. Arnoldo Morale who recommended  transsphenoidal resection of pituitary tumor.  I was consulted to assist in the approach of the removal of this tumor.  She was taken to the operating room at this time for transseptal transsphenoidal resection of pituitary tumor.  DESCRIPTION OF PROCEDURE:  The patient was positioned on the bed by Dr. Arnoldo Morale after undergoing endotracheal anesthesia.  Following this, the abdomen and the nose was prepped with Betadine solution and draped in sterile towels.  After prepping the nose  with Betadine, the nose was then further prepped with cotton pledgets soaked in Afrin and the septum and floor of the nose was injected with Xylocaine with epinephrine for hemostasis.  I then performed the approach through the nose.  I started off with  an elongated hemitransfixion incision along the cartilage of the septum on the right side.  The mucoperiosteum was elevated off of the floor on the right side and then elevated up along the septum with elevation of the mucoperichondrium off of the right  side of the septum.  Then on the left side, the mucoperiosteum was elevated  off of the floor on the left side up to the maxillary crest.  Next, attention was carried back to the right side of the septum where the mucoperichondrial and mucoperiosteal flap  was elevated posteriorly.  Approximately 3 cm posterior to the anterior cartilaginous septum, a vertical incision was made and mucoperichondrial and mucoperiosteal flaps were elevated on either side of the septum posterior to this.  The anterior  cartilaginous septum was dissected off of the maxillary crest and pushed into the left nasal airway.  A piece of cartilage was harvested for later use for closure with Dr. Arnoldo Morale.  Then, bony septum was removed posteriorly back to the face of the  sphenoid sinus.  At this point,  the [facility] retractors were positioned.  The posterior septal bone was removed off of the sphenoid sinus and a portion of the sphenoid sinus was opened.  Then, using Kerrison forceps, the sphenoidotomy was enlarged.  The  sphenoid septum was identified and it was more to the right side of the sphenoid and approached the sella with a pituitary tumor on the right side.  After adequate enlarging the sphenoidotomy, Dr. Arnoldo Morale was called back in to remove the bone from the  septum and remove the pituitary tumor.  After the removal of the pituitary tumor by Dr. Arnoldo Morale, I was called back in for closure.  Some fat was placed within the sphenoid sinus, along with the cartilage that had previously been harvested by myself and  this was placed by Dr. Arnoldo Morale.  The anterior cartilaginous septum was placed back on the maxillary crest, back in the midline.  The mucoperiosteal flaps were placed back to  the midline.  The extended hemitransfixion incision was closed with interrupted  4-0 chromic suture.  The septum was basted with a 4-0 chromic suture and then splints were secured to either side of the septum with a 3-0 nylon suture.  There was minimal bleeding and the nose was packed with 2 Merocel packs soaked in mupirocin 2%   ointment.  The Merocel packs were then hydrated with saline.  Oropharynx was cleaned of any blood and blood clots with suction and was otherwise clear.  The patient was subsequently awoken from anesthesia and transferred back to the recovery room.  VN/NUANCE  D:06/28/2020 T:06/28/2020 JOB:[id number]

## 2020-06-28 NOTE — Anesthesia Procedure Notes (Signed)
Procedure Name: Intubation Date/Time: 06/28/2020 12:25 PM Performed by: Trinna Post., CRNA Pre-anesthesia Checklist: Patient identified, Emergency Drugs available, Suction available, Patient being monitored and Timeout performed Patient Re-evaluated:Patient Re-evaluated prior to induction Oxygen Delivery Method: Circle system utilized Preoxygenation: Pre-oxygenation with 100% oxygen Induction Type: IV induction Ventilation: Mask ventilation without difficulty Laryngoscope Size: Mac and 3 Grade View: Grade I Tube type: Oral Tube size: 8.0 mm Number of attempts: 1 Airway Equipment and Method: Stylet Placement Confirmation: ETT inserted through vocal cords under direct vision,  positive ETCO2 and breath sounds checked- equal and bilateral Secured at: 21 cm Tube secured with: Tape Dental Injury: Teeth and Oropharynx as per pre-operative assessment

## 2020-06-28 NOTE — Progress Notes (Signed)
Subjective: The patient is alert and pleasant. Her mother is at the bedside.  Objective: Vital signs in last 24 hours: Temp:  [97.3 F (36.3 C)-98.5 F (36.9 C)] 97.3 F (36.3 C) (09/22 1700) Pulse Rate:  [72-88] 79 (09/22 1900) Resp:  [16-26] 17 (09/22 1900) BP: (114-123)/(55-81) 123/81 (09/22 1900) SpO2:  [96 %-100 %] 100 % (09/22 1900) Weight:  [68 kg-70 kg] 70 kg (09/22 1700) Estimated body mass index is 22.79 kg/m as calculated from the following:   Height as of this encounter: 5\' 9"  (1.753 m).   Weight as of this encounter: 70 kg.   Intake/Output from previous day: No intake/output data recorded. Intake/Output this shift: No intake/output data recorded.  Physical exam the patient is alert and oriented. Her strength and speech is normal. She says her right vision is better already. She looks well.  Lab Results: Recent Labs    06/26/20 0908  WBC 7.3  HGB 12.2  HCT 39.3  PLT 291   BMET Recent Labs    06/28/20 1820  CREATININE 0.76    Studies/Results: No results found.  Assessment/Plan: Status post transoral resection of pituitary macroadenoma: The patient is doing well. She will likely go home on Friday. I have answered all their questions.  LOS: 0 days     Ophelia Charter 06/28/2020, 7:18 PM

## 2020-06-28 NOTE — Transfer of Care (Signed)
Immediate Anesthesia Transfer of Care Note  Patient: [name]  Procedure(s) Performed: Transeptal Transphenoidal resection of pituitary tumor (N/A Nose) Transnasal Approach for resection of Pituitary Tumor (N/A Nose)  Patient Location: PACU  Anesthesia Type:General  Level of Consciousness: drowsy  Airway & Oxygen Therapy: Patient Spontanous Breathing and Patient connected to face mask oxygen  Post-op Assessment: Report given to RN, Post -op Vital signs reviewed and stable and Patient moving all extremities  Post vital signs: Reviewed and stable  Last Vitals:  Vitals Value Taken Time  BP 119/62 06/28/20 1558  Temp    Pulse 88 06/28/20 1558  Resp 21 06/28/20 1558  SpO2 98 % 06/28/20 1558    Last Pain:  Vitals:   06/28/20 1558  TempSrc:   PainSc: 0-No pain      Patients Stated Pain Goal: 3 (15/95/39 6728)  Complications: No complications documented.

## 2020-06-28 NOTE — Brief Op Note (Signed)
06/28/2020  3:46 PM  PATIENT:  [name]  29 y.o. female  PRE-OPERATIVE DIAGNOSIS:  PITUITARY MACROADENOMA  POST-OPERATIVE DIAGNOSIS:  PITUITARY MACROADENOMA  PROCEDURE:  Procedure(s): Transeptal Transphenoidal resection of pituitary tumor (N/A) Transnasal Approach for resection of Pituitary Tumor (N/A)  SURGEON:  Surgeon(s) and Role: Panel 1:    Elianne Gubser Pies, MD - Primary Panel 2:    * Rozetta Nunnery, MD - Primary  PHYSICIAN ASSISTANT:   ASSISTANTS: none   ANESTHESIA:   general  EBL:  150 mL   BLOOD ADMINISTERED:none  DRAINS: none   LOCAL MEDICATIONS USED:  XYLOCAINE  w EPI 10 cc  SPECIMEN:  Source of Specimen:  pituitary gland  DISPOSITION OF SPECIMEN:  PATHOLOGY  COUNTS:  YES  TOURNIQUET:  * No tourniquets in log *  DICTATION: .Other Dictation: Dictation Number [id number]  PLAN OF CARE: Admit to inpatient   PATIENT DISPOSITION:  PACU - hemodynamically stable.   Delay start of Pharmacological VTE agent (>24hrs) due to surgical blood loss or risk of bleeding: yes

## 2020-06-28 NOTE — Anesthesia Postprocedure Evaluation (Signed)
Anesthesia Post Note  Patient: [name]  Procedure(s) Performed: Transeptal Transphenoidal resection of pituitary tumor (N/A Nose) Transnasal Approach for resection of Pituitary Tumor (N/A Nose)     Patient location during evaluation: PACU Anesthesia Type: General Level of consciousness: awake and alert Pain management: pain level controlled Vital Signs Assessment: post-procedure vital signs reviewed and stable Respiratory status: spontaneous breathing, nonlabored ventilation and respiratory function stable Cardiovascular status: blood pressure returned to baseline and stable Postop Assessment: no apparent nausea or vomiting Anesthetic complications: no   No complications documented.  Last Vitals:  Vitals:   06/28/20 1630 06/28/20 1700  BP: 120/71 123/69  Pulse: 74 76  Resp: 18 (!) 23  Temp:  (!) 36.3 C  SpO2: 98% 96%    Last Pain:  Vitals:   06/28/20 1700  TempSrc: Axillary  PainSc: 0-No pain                 Marcia Lepera,W. EDMOND

## 2020-06-28 NOTE — Anesthesia Preprocedure Evaluation (Addendum)
Anesthesia Evaluation  Patient identified by MRN, date of birth, ID band Patient awake    Reviewed: Allergy & Precautions, H&P , NPO status , Patient's Chart, lab work & pertinent test results  Airway Mallampati: II  TM Distance: >3 FB Neck ROM: Full    Dental no notable dental hx. (+) Teeth Intact, Dental Advisory Given   Pulmonary neg pulmonary ROS,    Pulmonary exam normal breath sounds clear to auscultation       Cardiovascular negative cardio ROS   Rhythm:Regular Rate:Normal     Neuro/Psych negative neurological ROS  negative psych ROS   GI/Hepatic negative GI ROS, Neg liver ROS,   Endo/Other  negative endocrine ROS  Renal/GU negative Renal ROS  negative genitourinary   Musculoskeletal   Abdominal   Peds  Hematology negative hematology ROS (+)   Anesthesia Other Findings   Reproductive/Obstetrics negative OB ROS                            Anesthesia Physical Anesthesia Plan  ASA: II  Anesthesia Plan: General   Post-op Pain Management:    Induction: Intravenous  PONV Risk Score and Plan: 4 or greater and Ondansetron, Dexamethasone and Midazolam  Airway Management Planned: Oral ETT  Additional Equipment: Arterial line  Intra-op Plan:   Post-operative Plan: Extubation in OR  Informed Consent: I have reviewed the patients History and Physical, chart, labs and discussed the procedure including the risks, benefits and alternatives for the proposed anesthesia with the patient or authorized representative who has indicated his/her understanding and acceptance.     Dental advisory given  Plan Discussed with: CRNA  Anesthesia Plan Comments:         Anesthesia Quick Evaluation

## 2020-06-28 NOTE — H&P (Signed)
Subjective: The patient is a 29 year old black female who presented with vision loss.  She was worked up with a brain MRI which demonstrated findings consistent with a large pituitary macroadenoma.  Her prolactin level was mildly elevated.  I discussed the various treatment options with her.  After weighing the risks, benefits and alternatives she has decided to proceed with transsphenoidal resection of her tumor.  History reviewed. No pertinent past medical history.  Past Surgical History:  Procedure Laterality Date  . NO PAST SURGERIES      No Known Allergies  Social History   Tobacco Use  . Smoking status: Never Smoker  . Smokeless tobacco: Never Used  Substance Use Topics  . Alcohol use: Yes    Comment: sociall    History reviewed. No pertinent family history. Prior to Admission medications   Medication Sig Start Date End Date Taking? Authorizing Provider  norgestimate-ethinyl estradiol (ORTHO-CYCLEN) 0.25-35 MG-MCG tablet Take 1 tablet by mouth daily. Patient not taking: Reported on 06/16/2020 03/19/19   Isabelle Course, MD     Review of Systems  Positive ROS: As above  All other systems have been reviewed and were otherwise negative with the exception of those mentioned in the HPI and as above.  Objective: Vital signs in last 24 hours: Temp:  [98.5 F (36.9 C)] 98.5 F (36.9 C) (09/22 1009) Pulse Rate:  [76] 76 (09/22 1009) Resp:  [17] 17 (09/22 1009) BP: (114)/(55) 114/55 (09/22 1009) SpO2:  [100 %] 100 % (09/22 1009) Weight:  [68 kg] 68 kg (09/22 1009) Estimated body mass index is 22.15 kg/m as calculated from the following:   Height as of this encounter: 5\' 9"  (1.753 m).   Weight as of this encounter: 68 kg.   General Appearance: Alert Head: Normocephalic, without obvious abnormality, atraumatic Eyes: PERRL, conjunctiva/corneas clear, EOM's intact,    Ears: Normal  Throat: Normal  Neck: Supple, Back: unremarkable Lungs: Clear to auscultation bilaterally,  respirations unlabored Heart: Regular rate and rhythm, no murmur, rub or gallop Abdomen: Soft, non-tender Extremities: Extremities normal, atraumatic, no cyanosis or edema Skin: unremarkable  NEUROLOGIC:   Mental status: alert and oriented,Motor Exam - grossly normal Sensory Exam - grossly normal Reflexes:  Coordination - grossly normal Gait - grossly normal Balance - grossly normal Cranial Nerves: I: smell Not tested  II: visual acuity   the patient has a bitemporal hemianopsia  II: visual fields Full to confrontation  II: pupils Equal, round, reactive to light  III,VII: ptosis None  III,IV,VI: extraocular muscles  Full ROM  V: mastication Normal  V: facial light touch sensation  Normal  V,VII: corneal reflex  Present  VII: facial muscle function - upper  Normal  VII: facial muscle function - lower Normal  VIII: hearing Not tested  IX: soft palate elevation  Normal  IX,X: gag reflex Present  XI: trapezius strength  5/5  XI: sternocleidomastoid strength 5/5  XI: neck flexion strength  5/5  XII: tongue strength  Normal    Data Review Lab Results  Component Value Date   WBC 7.3 06/26/2020   HGB 12.2 06/26/2020   HCT 39.3 06/26/2020   MCV 84.5 06/26/2020   PLT 291 06/26/2020   Lab Results  Component Value Date   NA 138 11/06/2018   K 4.1 11/06/2018   CL 104 11/06/2018   CO2 19 (L) 11/06/2018   BUN 18 11/06/2018   CREATININE 1.10 (H) 11/06/2018   GLUCOSE 94 11/06/2018   No results found for: INR,  PROTIME  Assessment/Plan: Pituitary macroadenoma: I have discussed the situation with the patient and reviewed her imaging studies with her.  We have discussed the various treatment options including surgery.  I have described the surgical treatment option of a transsphenoidal resection of her tumor with harvesting of abdominal fat graft.  I have shown her surgical models.  We have discussed the risk, benefits, alternatives, expected postoperative course, and likelihood  of achieving our goals with surgery.  I have answered all her questions.  She has decided proceed with the surgery.   Ophelia Charter 06/28/2020 11:34 AM

## 2020-06-28 NOTE — Op Note (Signed)
Brief history: The patient is a 29 year old black female who presented with vision loss.  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  Her prolactin level was mildly elevated.  I discussed the situation with the patient and recommended transsphenoidal resection of the tumor.  The patient has weighed the risks benefits and alternatives and decided to proceed with surgery.  Preop diagnosis: Sellar and suprasellar tumor  Postop diagnosis: The same  Procedure: Transsphenoidal resection of tumor using micro dissection; harvesting of abdominal bone graft  Surgeon: Dr. Earle Gell  Exposure Surgeon: Dr. Radene Journey  Anesthesia: General endotracheal  Estimated blood loss: 75 cc  Specimens: Tumor  Drains: None  Complications: None  Description of procedure: The patient was brought to the operating room by the anesthesia team.  General endotracheal anesthesia was induced.  She remained in the supine position.  I applied the [place] three-point headrest to her calvarium.  Dr. Lucia Gaskins and I then positioned the patient for optimal exposure.  Dr. Lucia Gaskins did the exposure and closure surgery.  For further details please refer to his operative note.  After Dr. Lucia Gaskins had exposed the floor of the sella, I took over the operation.  We brought the operative microscope into the field.  And under his medication and illumination we completed the micro dissection.  The floor of the sella was somewhat thickened so I used a high-speed drill to roll off a bit of the floor of the sella.  We completed the removal of the floor of the sella using the Kerrison punches.  This exposed the dura.  I incised the dura in a cruciate fashion with the scalpel.  This exposed the tumor.  We obtained several specimens for the pathologist.  I then used suction and the various curettes to remove as much of the tumor as I could see both  anteriorly posteriorly and laterally.  At the end of the resection we could see the diaphragm sella bulging down to the floor of the sella.  We then irrigated the wound out.  We obtained hemostasis with bipolar electrocautery and Surgi-Flo.  We now turned our attention to harvesting the abdominal fat graft.  The patient's right abdomen that was previously prepared with Betadine scrub and Betadine solution.  I then injected the area to be incised with lidocaine with epinephrine solution.  I then used a scalpel to make a transverse incision in the patient's right lower quadrant.  We used the wheat Lander retractor for exposure.  I then used the Metzenbaum scissors and electrocautery to obtain a subcutaneous fat graft.  We then remove the retractors and obtain hemostasis we then reapproximated patient's subcutaneous tissue with erupted 2-0 Vicryl suture.  We reapproximate the skin with Steri-Strips and benzoin.  Bacitracin ointment was applied to the wound.  I then placed a piece of the abdominal fat graft in the floor of the sella.  I secured in place using a piece of bone that Dr. Lucia Gaskins have obtained during the decompression.  At this point Dr. Lucia Gaskins took over the closure of the operation.  By report, all sponge, instrument and needle counts were correct at the end of my part of the operation.

## 2020-06-28 NOTE — Anesthesia Procedure Notes (Signed)
Arterial Line Insertion Start/End9/22/2021 12:00 PM, 06/28/2020 12:15 PM Performed by: Lavell Luster, CRNA, CRNA  Preanesthetic checklist: patient identified, IV checked, risks and benefits discussed, surgical consent, monitors and equipment checked, pre-op evaluation and timeout performed Lidocaine 1% used for infiltration Left, radial was placed Catheter size: 20 G Hand hygiene performed , maximum sterile barriers used  and Seldinger technique used Allen's test indicative of satisfactory collateral circulation Attempts: 2 Procedure performed without using ultrasound guided technique. Following insertion, dressing applied and Biopatch. Post procedure assessment: normal  Patient tolerated the procedure well with no immediate complications.

## 2020-06-29 ENCOUNTER — Encounter (HOSPITAL_COMMUNITY): Payer: Self-pay | Admitting: Neurosurgery

## 2020-06-29 LAB — BASIC METABOLIC PANEL
Anion gap: 13 (ref 5–15)
Anion gap: 10 (ref 5–15)
Anion gap: 8 (ref 5–15)
Anion gap: 7 (ref 5–15)
BUN: 7 mg/dL (ref 6–20)
BUN: 6 mg/dL (ref 6–20)
BUN: 5 mg/dL — ABNORMAL LOW (ref 6–20)
BUN: 6 mg/dL (ref 6–20)
CO2: 19 mmol/L — ABNORMAL LOW (ref 22–32)
CO2: 19 mmol/L — ABNORMAL LOW (ref 22–32)
CO2: 25 mmol/L (ref 22–32)
CO2: 26 mmol/L (ref 22–32)
Calcium: 8.7 mg/dL — ABNORMAL LOW (ref 8.9–10.3)
Calcium: 8.8 mg/dL — ABNORMAL LOW (ref 8.9–10.3)
Calcium: 8.6 mg/dL — ABNORMAL LOW (ref 8.9–10.3)
Calcium: 8.6 mg/dL — ABNORMAL LOW (ref 8.9–10.3)
Chloride: 108 mmol/L (ref 98–111)
Chloride: 114 mmol/L — ABNORMAL HIGH (ref 98–111)
Chloride: 110 mmol/L (ref 98–111)
Chloride: 106 mmol/L (ref 98–111)
Creatinine, Ser: 1.02 mg/dL — ABNORMAL HIGH (ref 0.44–1.00)
Creatinine, Ser: 0.95 mg/dL (ref 0.44–1.00)
Creatinine, Ser: 0.79 mg/dL (ref 0.44–1.00)
Creatinine, Ser: 0.77 mg/dL (ref 0.44–1.00)
GFR calc Af Amer: >60 mL/min (ref >60)
GFR calc Af Amer: >60 mL/min (ref >60)
GFR calc Af Amer: >60 mL/min (ref >60)
GFR calc Af Amer: >60 mL/min (ref >60)
GFR calc non Af Amer: >60 mL/min (ref >60)
GFR calc non Af Amer: >60 mL/min (ref >60)
GFR calc non Af Amer: >60 mL/min (ref >60)
GFR calc non Af Amer: >60 mL/min (ref >60)
Glucose, Bld: 215 mg/dL — ABNORMAL HIGH (ref 70–99)
Glucose, Bld: 242 mg/dL — ABNORMAL HIGH (ref 70–99)
Glucose, Bld: 124 mg/dL — ABNORMAL HIGH (ref 70–99)
Glucose, Bld: 109 mg/dL — ABNORMAL HIGH (ref 70–99)
Potassium: 4 mmol/L (ref 3.5–5.1)
Potassium: 3.5 mmol/L (ref 3.5–5.1)
Potassium: 3.9 mmol/L (ref 3.5–5.1)
Potassium: 3.9 mmol/L (ref 3.5–5.1)
Sodium: 140 mmol/L (ref 135–145)
Sodium: 143 mmol/L (ref 135–145)
Sodium: 143 mmol/L (ref 135–145)
Sodium: 139 mmol/L (ref 135–145)

## 2020-06-29 LAB — GLUCOSE, CAPILLARY
Glucose-Capillary: 238 mg/dL — ABNORMAL HIGH (ref 70–99)
Glucose-Capillary: 233 mg/dL — ABNORMAL HIGH (ref 70–99)
Glucose-Capillary: 113 mg/dL — ABNORMAL HIGH (ref 70–99)
Glucose-Capillary: 115 mg/dL — ABNORMAL HIGH (ref 70–99)
Glucose-Capillary: 110 mg/dL — ABNORMAL HIGH (ref 70–99)

## 2020-06-29 LAB — SURGICAL PATHOLOGY

## 2020-06-29 MED ORDER — SODIUM CHLORIDE 0.9 % IV SOLN
INTRAVENOUS | Status: DC | PRN
  Administered 2020-06-29: 500 mL via INTRAVENOUS

## 2020-06-29 MED ORDER — INSULIN ASPART 100 UNIT/ML ~~LOC~~ SOLN
0.0000 [IU] | SUBCUTANEOUS | Status: DC
  Administered 2020-06-29: 7 [IU] via SUBCUTANEOUS
  Administered 2020-06-30 (×2): 3 [IU] via SUBCUTANEOUS
  Administered 2020-07-01: 4 [IU] via SUBCUTANEOUS

## 2020-06-29 MED ORDER — INFLUENZA VAC SPLIT QUAD 0.5 ML IM SUSY
0.5000 mL | PREFILLED_SYRINGE | INTRAMUSCULAR | Status: DC
  Filled 2020-06-29: qty 0.5

## 2020-06-29 MED ORDER — DESMOPRESSIN ACETATE 4 MCG/ML IJ SOLN
2.0000 ug | Freq: Two times a day (BID) | INTRAMUSCULAR | Status: DC
  Administered 2020-06-29 (×2): 2 ug via INTRAVENOUS
  Filled 2020-06-29 (×3): qty 1

## 2020-06-29 MED ORDER — POTASSIUM CHLORIDE IN NACL 20-0.45 MEQ/L-% IV SOLN
INTRAVENOUS | Status: DC
  Filled 2020-06-29 (×2): qty 1000

## 2020-06-29 NOTE — Plan of Care (Signed)
Resp e/u, SpO2 100% on RA, lungs clear to auscultation.

## 2020-06-29 NOTE — Progress Notes (Signed)
   Providing Compassionate, Quality Care - Together   Subjective: Patient reports she's thirsty. She feels like her vision is improving, most notably in her right eye. No other issues overnight.  Objective: Vital signs in last 24 hours: Temp:  [97.3 F (36.3 C)-99 F (37.2 C)] 99 F (37.2 C) (09/23 0400) Pulse Rate:  [72-104] 81 (09/23 1000) Resp:  [11-26] 19 (09/23 1000) BP: (112-124)/(62-81) 112/62 (09/23 1000) SpO2:  [95 %-100 %] 100 % (09/23 1000) Arterial Line BP: (128-144)/(59-78) 135/70 (09/23 1000) Weight:  [70 kg] 70 kg (09/22 1700)  Intake/Output from previous day: 09/22 0701 - 09/23 0700 In: 2143.9 [I.V.:1793.9; IV Piggyback:300] Out: 2300 [Urine:2150; Blood:150] Intake/Output this shift: Total I/O In: 1783.5 [P.O.:1500; I.V.:283.5] Out: 2875 [Urine:2875]  Alert and oriented x 4 PERRLA Speech clear MAE, Strength and sensation intact Nasal packing in place   Lab Results: No results for input(s): WBC, HGB, HCT, PLT in the last 72 hours. BMET Recent Labs    06/29/20 0439 06/29/20 0755  NA 140 143  K 4.0 3.5  CL 108 114*  CO2 19* 19*  GLUCOSE 215* 242*  BUN 7 6  CREATININE 1.02* 0.95  CALCIUM 8.7* 8.8*    Studies/Results: No results found.  Assessment/Plan: Patient is one day status post transphenoidal pituitary tumor resection. She has developed significantly increased urine output. Blood glucose has been elevated postoperatively with no history of diabetes.   LOS: 1 day    -SSI added this morning, Continue ACHS CBGs with coverage -Spoke with Dr. Dwyane Dee of endocrinology for recommendations. Will start DDAVP 2 mcg BID. Changing fluids to 1/2 NS w/20 K+. Will continue to monitor BMET q4 hours. -Will keep patient in ICU today to monitor closely for DI -Repeat BMET this afternoon -Strict I and Os -Urine SG q6 hours -Encourage PO intake   Viona Gilmore, DNP, AGNP-C Nurse Practitioner  [facility] [place]. [address] [place], [place], [place] [address] P: [phone]    F: [phone]  06/29/2020, 10:44 AM

## 2020-06-29 NOTE — Progress Notes (Signed)
572ml urine, urine specif gravity 1.000. Dr. Arnoldo Morale aware. Verbal order for SSI.

## 2020-06-29 NOTE — Progress Notes (Signed)
POD 1 Patient awake and alert Minimal bleeding from nose No airway problems Peripheral vision improved Will plan on removing the nasal packs in the AM She will need follow up with me next Wednesday to have the septal splints removed

## 2020-06-30 LAB — GLUCOSE, CAPILLARY
Glucose-Capillary: 104 mg/dL — ABNORMAL HIGH (ref 70–99)
Glucose-Capillary: 110 mg/dL — ABNORMAL HIGH (ref 70–99)
Glucose-Capillary: 123 mg/dL — ABNORMAL HIGH (ref 70–99)
Glucose-Capillary: 125 mg/dL — ABNORMAL HIGH (ref 70–99)
Glucose-Capillary: 127 mg/dL — ABNORMAL HIGH (ref 70–99)

## 2020-06-30 LAB — BASIC METABOLIC PANEL
Anion gap: 7 (ref 5–15)
Anion gap: 7 (ref 5–15)
Anion gap: 6 (ref 5–15)
Anion gap: 9 (ref 5–15)
BUN: 9 mg/dL (ref 6–20)
BUN: 6 mg/dL (ref 6–20)
BUN: 6 mg/dL (ref 6–20)
BUN: 7 mg/dL (ref 6–20)
CO2: 25 mmol/L (ref 22–32)
CO2: 25 mmol/L (ref 22–32)
CO2: 29 mmol/L (ref 22–32)
CO2: 26 mmol/L (ref 22–32)
Calcium: 8.4 mg/dL — ABNORMAL LOW (ref 8.9–10.3)
Calcium: 8.4 mg/dL — ABNORMAL LOW (ref 8.9–10.3)
Calcium: 9 mg/dL (ref 8.9–10.3)
Calcium: 9.4 mg/dL (ref 8.9–10.3)
Chloride: 104 mmol/L (ref 98–111)
Chloride: 105 mmol/L (ref 98–111)
Chloride: 102 mmol/L (ref 98–111)
Chloride: 103 mmol/L (ref 98–111)
Creatinine, Ser: 0.7 mg/dL (ref 0.44–1.00)
Creatinine, Ser: 0.79 mg/dL (ref 0.44–1.00)
Creatinine, Ser: 0.78 mg/dL (ref 0.44–1.00)
Creatinine, Ser: 0.87 mg/dL (ref 0.44–1.00)
GFR calc Af Amer: >60 mL/min (ref >60)
GFR calc Af Amer: >60 mL/min (ref >60)
GFR calc Af Amer: >60 mL/min (ref >60)
GFR calc Af Amer: >60 mL/min (ref >60)
GFR calc non Af Amer: >60 mL/min (ref >60)
GFR calc non Af Amer: >60 mL/min (ref >60)
GFR calc non Af Amer: >60 mL/min (ref >60)
GFR calc non Af Amer: >60 mL/min (ref >60)
Glucose, Bld: 105 mg/dL — ABNORMAL HIGH (ref 70–99)
Glucose, Bld: 93 mg/dL (ref 70–99)
Glucose, Bld: 107 mg/dL — ABNORMAL HIGH (ref 70–99)
Glucose, Bld: 94 mg/dL (ref 70–99)
Potassium: 3.7 mmol/L (ref 3.5–5.1)
Potassium: 4 mmol/L (ref 3.5–5.1)
Potassium: 4.3 mmol/L (ref 3.5–5.1)
Potassium: 4.4 mmol/L (ref 3.5–5.1)
Sodium: 136 mmol/L (ref 135–145)
Sodium: 137 mmol/L (ref 135–145)
Sodium: 137 mmol/L (ref 135–145)
Sodium: 138 mmol/L (ref 135–145)

## 2020-06-30 LAB — HEMOGLOBIN A1C
Hgb A1c MFr Bld: 5.3 % (ref 4.8–5.6)
Mean Plasma Glucose: 105 mg/dL

## 2020-06-30 MED ORDER — DESMOPRESSIN ACE SPRAY REFRIG 0.01 % NA SOLN
1.0000 | Freq: Two times a day (BID) | NASAL | Status: DC
  Administered 2020-06-30 – 2020-07-01 (×3): 1 via NASAL
  Filled 2020-06-30: qty 5

## 2020-06-30 MED ORDER — HYDROCORTISONE 5 MG PO TABS: 25.0000 mg | ORAL_TABLET | Freq: Every day | ORAL | Status: DC

## 2020-06-30 MED ORDER — HYDROCORTISONE 20 MG PO TABS
100.0000 mg | ORAL_TABLET | Freq: Two times a day (BID) | ORAL | Status: AC
  Administered 2020-06-30 (×2): 100 mg via ORAL
  Filled 2020-06-30 (×3): qty 5

## 2020-06-30 MED ORDER — HYDROCORTISONE 20 MG PO TABS
50.0000 mg | ORAL_TABLET | Freq: Two times a day (BID) | ORAL | Status: DC
  Administered 2020-07-01: 50 mg via ORAL
  Filled 2020-06-30 (×2): qty 1

## 2020-06-30 NOTE — Progress Notes (Signed)
POD 2 Awake alert AF VSS Patient in ICU monitoring possible DI started DDAVP. No respiratory problems with minimal bleeding from nose Nasal packs were removed with minimal bleeding. Patient able to breath OK through nose Cyndia will need follow up in my office next Wednesday at 11:45 to have nasal splints removed and discussed this with her.

## 2020-06-30 NOTE — Consult Note (Signed)
   [facility] Inpatient Consult   06/30/2020  [name] 04/27/1991 [id number]    [place] [facility] [ACO] Patient:  [name] plan  Acknowledgement note:  Patient has been assigned to a [place] Coordinator for the [place] for community follow up and resource support.     Plan: Patient will be followed by [place] Coordinator.   For additional questions or referrals please contact:   Natividad Brood, RN BSN [facility] Liaison  [phone] business mobile phone Toll free office [phone]  Fax number: [phone] [email or web address]

## 2020-06-30 NOTE — Progress Notes (Signed)
   Providing Compassionate, Quality Care - Together   Subjective: Patient reports no issues overnight. Nasal packing removed recently.  Objective: Vital signs in last 24 hours: Temp:  [97.4 F (36.3 C)-98.8 F (37.1 C)] 98.1 F (36.7 C) (09/24 0800) Pulse Rate:  [65-101] 65 (09/24 0800) Resp:  [13-28] 13 (09/24 0800) BP: (97-128)/(55-81) 120/69 (09/24 0800) SpO2:  [98 %-100 %] 100 % (09/24 0800) Arterial Line BP: (77-155)/(60-83) 120/69 (09/24 0800)  Intake/Output from previous day: 09/23 0701 - 09/24 0700 In: 3235.6 [P.O.:1500; I.V.:1435.6; IV Piggyback:300] Out: 7475 [Urine:7475] Intake/Output this shift: No intake/output data recorded.   Alert and oriented x 4 PERRLA Speech clear MAE, Strength and sensation intact Gauze at nares with small amount of serosanguinous drainage   Lab Results: No results for input(s): WBC, HGB, HCT, PLT in the last 72 hours. BMET Recent Labs    06/29/20 1925 06/30/20 0200  NA 139 136  K 3.9 3.7  CL 106 104  CO2 26 25  GLUCOSE 109* 105*  BUN 6 9  CREATININE 0.77 0.70  CALCIUM 8.6* 8.4*    Studies/Results: No results found.  Assessment/Plan: Patient is two days status post transphenoidal pituitary tumor resection. She developed significantly increased urine output on postop day 1. Blood glucose has been elevated postoperatively with no history of diabetes. A1C normal and CBGs are starting to normalize. This is likely a response to cortisol following pituitary tumor resection.   LOS: 2 days    -Continue CBGs with SSI for now -Transfer out of ICU today. Likely discharge home tomorrow -Remove Foley catheter and arterial line -Continue to encourage PO intake -Will transition to intranasal DDAVP   Viona Gilmore, DNP, AGNP-C Nurse Practitioner  [facility] [address], [address], [place], [place] [address] P: [phone]    F: [phone]  06/30/2020, 9:10 AM

## 2020-07-01 LAB — BASIC METABOLIC PANEL
Anion gap: 7 (ref 5–15)
Anion gap: 11 (ref 5–15)
BUN: 8 mg/dL (ref 6–20)
BUN: 10 mg/dL (ref 6–20)
CO2: 26 mmol/L (ref 22–32)
CO2: 26 mmol/L (ref 22–32)
Calcium: 9.1 mg/dL (ref 8.9–10.3)
Calcium: 9.6 mg/dL (ref 8.9–10.3)
Chloride: 103 mmol/L (ref 98–111)
Chloride: 101 mmol/L (ref 98–111)
Creatinine, Ser: 0.74 mg/dL (ref 0.44–1.00)
Creatinine, Ser: 0.78 mg/dL (ref 0.44–1.00)
GFR calc Af Amer: >60 mL/min (ref >60)
GFR calc Af Amer: >60 mL/min (ref >60)
GFR calc non Af Amer: >60 mL/min (ref >60)
GFR calc non Af Amer: >60 mL/min (ref >60)
Glucose, Bld: 121 mg/dL — ABNORMAL HIGH (ref 70–99)
Glucose, Bld: 118 mg/dL — ABNORMAL HIGH (ref 70–99)
Potassium: 4 mmol/L (ref 3.5–5.1)
Potassium: 3.8 mmol/L (ref 3.5–5.1)
Sodium: 136 mmol/L (ref 135–145)
Sodium: 138 mmol/L (ref 135–145)

## 2020-07-01 LAB — GLUCOSE, CAPILLARY
Glucose-Capillary: 120 mg/dL — ABNORMAL HIGH (ref 70–99)
Glucose-Capillary: 170 mg/dL — ABNORMAL HIGH (ref 70–99)
Glucose-Capillary: 134 mg/dL — ABNORMAL HIGH (ref 70–99)

## 2020-07-01 MED ORDER — DOCUSATE SODIUM 100 MG PO CAPS
100.0000 mg | ORAL_CAPSULE | Freq: Two times a day (BID) | ORAL | 0 refills | Status: DC
Start: 2020-07-01 — End: 2020-08-11

## 2020-07-01 MED ORDER — HYDROCORTISONE 5 MG PO TABS: ORAL_TABLET | ORAL | 2 refills | Status: DC

## 2020-07-01 MED ORDER — DESMOPRESSIN ACE SPRAY REFRIG 0.01 % NA SOLN: 1.0000 | Freq: Two times a day (BID) | NASAL | 2 refills | Status: DC

## 2020-07-01 MED ORDER — HYDROCODONE-ACETAMINOPHEN 5-325 MG PO TABS: 1.0000 | ORAL_TABLET | ORAL | 0 refills | Status: DC | PRN

## 2020-07-01 NOTE — Discharge Instructions (Signed)
Walk as much as possible No heavy lifting >10 lbs No using straws, sneeze and cough out of mouth, no blowing nose F/u with endocrinology

## 2020-07-01 NOTE — Discharge Summary (Signed)
  Physician Discharge Summary  Patient ID: [name] MRN: [id number] DOB/AGE: [birth year] 29 y.o.  Admit date: 06/28/2020 Discharge date: 07/01/2020  Admission Diagnoses:  Pituitary tumor  Discharge Diagnoses:  Same Active Problems:   Pituitary tumor   Pituitary macroadenoma with extrasellar extension [facility])   Discharged Condition: Stable  Hospital Course:  Kursten Kruk is a 29 y.o. female with admitted after elective transsphenoidal resection of a pituitary tumor.  Pathology returned as pituitary adenoma.  Postoperatively, she was started on intranasal DDAVP as well as hydrocortisone.  Follow-up with endocrinology as outpatient was arranged.  Her nasal packing was removed and a drip pad was placed by the ENT service who assisted with surgery.  She will follow up with endocrinology, ENT, and neurosurgery.  She was deemed ready for discharge home on 07/01/2020.  Treatments: Surgery -microscopic transsphenoidal resection of pituitary tumor  Discharge Exam: Blood pressure 107/65, pulse (!) 56, temperature 97.6 F (36.4 C), resp. rate 20, height 5\' 9"  (1.753 m), weight 70 kg, last menstrual period 06/12/2020, SpO2 99 %. Awake, alert, oriented Speech fluent, appropriate CN grossly intact Visual fields grossly intact 5/5 BUE/BLE Drip pad dry  Disposition: Discharge disposition: 01-Home or Self Care          Follow-up Information    Rozetta Nunnery, MD In 5 days.   Specialty: Otolaryngology Why: Next Wednesday at Dr Pollie Friar office at 11:45 to recheck nose and remove splints Contact information: [place] [address] [phone]        Newman Pies, MD Follow up in 10 day(s).   Specialty: Neurosurgery Contact information: [address] [place] [address] [place] [address] [phone]               Signed: Vallarie Mare 07/01/2020, 11:44 AM

## 2020-07-03 ENCOUNTER — Other Ambulatory Visit: Payer: Self-pay | Admitting: *Deleted

## 2020-07-03 NOTE — Patient Outreach (Addendum)
[facility]) Care Management  07/03/2020  Davetta Olliff 04-28-1991 [id number]   [place] Transition of care-No Needs Initial outreach- d/c 9/25  RN spoke with pt today and introduced [facility] services and the purpose for today's call. Pt verified credentials and indicated she was doing well with her ongoing recovery. Transition of care template completed and all medications reviewed with no further needs or resources at this time. Pt aware of all her appointments and has sufficiently transportation. No further needs or request at this time.   Plan: No ongoing care management needs identified so will close case to [place] Management services and route successful outreach letter with [place] Management pamphlet and 24 Hour Nurse Line Magnet to [place] Management clinical pool to be mailed to patient's home address.   Raina Mina, RN Care Management Coordinator [place] Office [phone]

## 2020-07-05 ENCOUNTER — Other Ambulatory Visit: Payer: Self-pay

## 2020-07-05 ENCOUNTER — Ambulatory Visit (INDEPENDENT_AMBULATORY_CARE_PROVIDER_SITE_OTHER): Payer: No Typology Code available for payment source | Admitting: Otolaryngology

## 2020-07-05 VITALS — Temp 97.2°F

## 2020-07-05 DIAGNOSIS — Z4889 Encounter for other specified surgical aftercare: Secondary | ICD-10-CM

## 2020-07-05 NOTE — Progress Notes (Signed)
HPI: [name] is a 29 y.o. female who presents seven days s/p transsphenoidal resection of pituitary tumor with Dr. Arnoldo Morale.  She is doing well with improvement of her vision.  She has had just thick mucus discharge from her nose with no complaints of watery drainage from the nose.  She has had mild nasal congestion but is able to breathe adequately through the nose on both sides..   No past medical history on file. Past Surgical History:  Procedure Laterality Date  . CRANIOTOMY N/A 06/28/2020   Procedure: Transeptal Transphenoidal resection of pituitary tumor;  Surgeon: Bernardina Cacho Pies, MD;  Location: [facility];  Service: Neurosurgery;  Laterality: N/A;  . NO PAST SURGERIES    . TRANSNASAL APPROACH N/A 06/28/2020   Procedure: Transnasal Approach for resection of Pituitary Tumor;  Surgeon: Rozetta Nunnery, MD;  Location: [facility] OR;  Service: ENT;  Laterality: N/A;   Social History   Socioeconomic History  . Marital status: Single    Spouse name: Not on file  . Number of children: Not on file  . Years of education: Not on file  . Highest education level: Not on file  Occupational History  . Not on file  Tobacco Use  . Smoking status: Never Smoker  . Smokeless tobacco: Never Used  Vaping Use  . Vaping Use: Never used  Substance and Sexual Activity  . Alcohol use: Yes    Comment: sociall  . Drug use: No  . Sexual activity: Yes    Birth control/protection: Condom  Other Topics Concern  . Not on file  Social History Narrative   Right handed    Lives alone   Caffeine use: sometimes (mostly coffee)   Social Determinants of Health   Financial Resource Strain:   . Difficulty of Paying Living Expenses: Not on file  Food Insecurity:   . Worried About Charity fundraiser in the Last Year: Not on file  . Ran Out of Food in the Last Year: Not on file  Transportation Needs:   . Lack of Transportation (Medical): Not on file  . Lack of Transportation (Non-Medical): Not on file   Physical Activity:   . Days of Exercise per Week: Not on file  . Minutes of Exercise per Session: Not on file  Stress:   . Feeling of Stress : Not on file  Social Connections:   . Frequency of Communication with Friends and Family: Not on file  . Frequency of Social Gatherings with Friends and Family: Not on file  . Attends Religious Services: Not on file  . Active Member of Clubs or Organizations: Not on file  . Attends Archivist Meetings: Not on file  . Marital Status: Not on file   No family history on file. No Known Allergies Prior to Admission medications   Medication Sig Start Date End Date Taking? Authorizing Provider  desmopressin (DDAVP) 0.01 % SOLN Place 1 spray into the nose 2 (two) times daily. Patient not taking: Reported on 07/03/2020 07/01/20   Vallarie Mare, MD  docusate sodium (COLACE) 100 MG capsule Take 1 capsule (100 mg total) by mouth 2 (two) times daily. 07/01/20   Vallarie Mare, MD  HYDROcodone-acetaminophen (NORCO/VICODIN) 5-325 MG tablet Take 1 tablet by mouth every 4 (four) hours as needed for moderate pain. 07/01/20   Vallarie Mare, MD  hydrocortisone (CORTEF) 5 MG tablet 20 mg PO every morning, 10 mg PO every evening 07/01/20   Vallarie Mare, MD  norgestimate-ethinyl  estradiol (ORTHO-CYCLEN) 0.25-35 MG-MCG tablet Take 1 tablet by mouth daily. 03/19/19   Isabelle Course, MD     Physical Exam: Septal splints were removed from both sides of the septum.  Nasal passages are clear bilaterally with no clinical evidence of CSF leak.  Septum is healing nicely with no evidence of septal perforation   Assessment: S/p transseptal transsphenoidal resection of pituitary tumor performed on 06/28/2020.  Plan: Septal splints were removed in the office today and septum is healing nicely. She will follow-up as needed.  She will call us if she has any trouble with her nose or trouble breathing.   Radene Journey, MD

## 2020-07-10 ENCOUNTER — Encounter (HOSPITAL_COMMUNITY): Payer: Self-pay | Admitting: Neurosurgery

## 2020-07-10 NOTE — Addendum Note (Signed)
Addendum  created 07/10/20 0939 by Roderic Palau, MD   Intraprocedure Event edited, Intraprocedure Staff edited

## 2020-07-24 ENCOUNTER — Ambulatory Visit: Payer: No Typology Code available for payment source | Admitting: Endocrinology

## 2020-07-24 ENCOUNTER — Encounter: Payer: Self-pay | Admitting: Endocrinology

## 2020-08-11 ENCOUNTER — Encounter: Payer: Self-pay | Admitting: Internal Medicine

## 2020-08-11 ENCOUNTER — Other Ambulatory Visit: Payer: Self-pay

## 2020-08-11 ENCOUNTER — Ambulatory Visit (INDEPENDENT_AMBULATORY_CARE_PROVIDER_SITE_OTHER): Payer: No Typology Code available for payment source | Admitting: Internal Medicine

## 2020-08-11 ENCOUNTER — Other Ambulatory Visit (HOSPITAL_COMMUNITY)
Admission: RE | Admit: 2020-08-11 | Discharge: 2020-08-11 | Disposition: A | Discharge status: Home / Self Care | Payer: No Typology Code available for payment source | Source: Ambulatory Visit | Attending: Internal Medicine | Admitting: Internal Medicine

## 2020-08-11 VITALS — BP 111/73 | HR 68 | Temp 98.9°F | Ht 69.0 in | Wt 167.5 lb

## 2020-08-11 DIAGNOSIS — Z113 Encounter for screening for infections with a predominantly sexual mode of transmission: Secondary | ICD-10-CM | POA: Insufficient documentation

## 2020-08-11 DIAGNOSIS — Z7251 High risk heterosexual behavior: Secondary | ICD-10-CM

## 2020-08-11 DIAGNOSIS — Z111 Encounter for screening for respiratory tuberculosis: Secondary | ICD-10-CM

## 2020-08-11 DIAGNOSIS — D352 Benign neoplasm of pituitary gland: Secondary | ICD-10-CM | POA: Diagnosis not present

## 2020-08-11 DIAGNOSIS — Z30011 Encounter for initial prescription of contraceptive pills: Secondary | ICD-10-CM

## 2020-08-11 DIAGNOSIS — Z0000 Encounter for general adult medical examination without abnormal findings: Secondary | ICD-10-CM

## 2020-08-11 MED ORDER — NORGESTIMATE-ETH ESTRADIOL 0.25-35 MG-MCG PO TABS: 1.0000 | ORAL_TABLET | Freq: Every day | ORAL | 11 refills | Status: AC

## 2020-08-11 MED FILL — FEMYNOR 0.25-35 MG-MCG TABS: 0.25-35 | 28 days supply | Qty: 28 | Fill #0

## 2020-08-11 NOTE — Patient Instructions (Signed)
Ms. [name],  It was a pleasure to see you today. Thank you for coming in.   Today we did a general check up.  We are checking some labs and will contact you if they are abnormal.  We tested for TB and will contact you with the results.    I have sent in a different brand of the birth control to see if you have better side effects.  Please return to clinic in 1 year or sooner if needed.   Thank you again for coming in.   [name].D.

## 2020-08-11 NOTE — Progress Notes (Signed)
   CC: General check up, request for pregnancy test and STD testing  HPI:  [name] is a 29 y.o. with a history of pituitary macroadenoma status post transsphenoidal resection and previously on birth control presenting for general checkup, request for pregnancy test, and STD testing.  She denies any specific complaints today.  No past medical history on file. Review of Systems:   Constitutional: Negative for chills and fever.  Respiratory: Negative for shortness of breath.   Cardiovascular: Negative for chest pain and leg swelling.  Gastrointestinal: Negative for abdominal pain, nausea and vomiting.  Neurological: Negative for dizziness and headaches.   Physical Exam:  Vitals:   08/11/20 1109  BP: 111/73  Pulse: 68  Temp: 98.9 F (37.2 C)  TempSrc: Oral  SpO2: 100%  Weight: 167 lb 8 oz (76 kg)  Height: 5\' 9"  (1.753 m)   Physical Exam Constitutional:      Appearance: Normal appearance. She is normal weight.  HENT:     Nose: Nose normal.     Mouth/Throat:     Mouth: Mucous membranes are moist.     Pharynx: Oropharynx is clear.  Eyes:     Extraocular Movements: Extraocular movements intact.     Conjunctiva/sclera: Conjunctivae normal.     Pupils: Pupils are equal, round, and reactive to light.  Cardiovascular:     Rate and Rhythm: Normal rate and regular rhythm.     Pulses: Normal pulses.     Heart sounds: Normal heart sounds.  Pulmonary:     Effort: Pulmonary effort is normal.     Breath sounds: Normal breath sounds.  Abdominal:     General: Abdomen is flat. Bowel sounds are normal.     Palpations: Abdomen is soft.  Musculoskeletal:        General: No swelling. Normal range of motion.  Skin:    General: Skin is warm and dry.     Capillary Refill: Capillary refill takes less than 2 seconds.  Neurological:     General: No focal deficit present.     Mental Status: She is alert and oriented to person, place, and time.  Psychiatric:        Mood and  Affect: Mood normal.        Behavior: Behavior normal.      Assessment & Plan:   See Encounters Tab for problem based charting.  Patient discussed with Dr. Heber Hendron

## 2020-08-12 LAB — PREGNANCY, URINE: Preg Test, Ur: NEGATIVE

## 2020-08-12 LAB — HEPATITIS B SURFACE ANTIGEN: Hepatitis B Surface Ag: NEGATIVE

## 2020-08-12 LAB — HEPATITIS B SURFACE ANTIBODY,QUALITATIVE: Hep B Surface Ab, Qual: REACTIVE

## 2020-08-12 LAB — HIV ANTIBODY (ROUTINE TESTING W REFLEX): HIV Screen 4th Generation wRfx: NONREACTIVE

## 2020-08-12 LAB — RPR: RPR Ser Ql: NONREACTIVE

## 2020-08-12 LAB — HEPATITIS C ANTIBODY: Hep C Virus Ab: <0.1 s/co ratio (ref 0.0–0.9)

## 2020-08-13 LAB — QUANTIFERON-TB GOLD PLUS
QuantiFERON Mitogen Value: >10 IU/mL
QuantiFERON Nil Value: 0 IU/mL
QuantiFERON TB1 Ag Value: 0 IU/mL
QuantiFERON TB2 Ag Value: 0.01 IU/mL
QuantiFERON-TB Gold Plus: NEGATIVE

## 2020-08-14 NOTE — Assessment & Plan Note (Signed)
Patient requesting TB test with Quanteferon gold for her work, ordered this.

## 2020-08-14 NOTE — Assessment & Plan Note (Signed)
Patient is currently sexually active, uses intermittently, she reports that she birth control she had been previously prescribed caused her to have some lightheadedness so she has not been taking this.  Last menstrual period was 1 month ago.  She is requesting STD check and pregnancy testing.  She denies any abdominal pain, vaginal discharge, pruritus, hematuria or dysuria.  Discussed checking a different brand of contraception to see if that helps.  -Obtain HIV, RPR, HPV and HCV testing -Wet prep for GC, chlamydia, trichomonas -Advised to ask for different brand of birth control at pharmacy, sent in [place]

## 2020-08-14 NOTE — Assessment & Plan Note (Signed)
Patient has a history of pituitary macroadenoma status post transsphenoidal resection.  She has had improvement in her vision and inner congestion.  She previously had been on steroids however has now completed this course.  She has not followed up with neurosurgery yet.  Overall this appears to be stable.  Advised to follow-up with neurosurgery for final check up.

## 2020-08-15 LAB — URINE CYTOLOGY ANCILLARY ONLY
Bacterial Vaginitis-Urine: POSITIVE — AB
Candida Urine: NEGATIVE
Chlamydia: NEGATIVE
Comment: NEGATIVE
Comment: NEGATIVE
Comment: NORMAL
Neisseria Gonorrhea: NEGATIVE
Trichomonas: NEGATIVE

## 2020-08-15 NOTE — Progress Notes (Signed)
Internal Medicine Clinic Attending ° °Case discussed with Dr. Krienke  At the time of the visit.  We reviewed the resident’s history and exam and pertinent patient test results.  I agree with the assessment, diagnosis, and plan of care documented in the resident’s note.  °

## 2020-08-17 ENCOUNTER — Other Ambulatory Visit: Payer: Self-pay | Admitting: Neurosurgery

## 2020-08-17 DIAGNOSIS — D352 Benign neoplasm of pituitary gland: Secondary | ICD-10-CM

## 2020-09-07 ENCOUNTER — Other Ambulatory Visit: Payer: No Typology Code available for payment source

## 2020-09-11 ENCOUNTER — Encounter: Payer: No Typology Code available for payment source | Admitting: Internal Medicine

## 2020-09-11 NOTE — Progress Notes (Deleted)
   CC: ***  HPI:  Ms.Mariellen Feliz is a 29 y.o. with PMH as below.   Please see A&P for assessment of the patient's acute and chronic medical conditions.   No past medical history on file. Review of Systems:  *** 10 point ROS negative except as noted in HPI  Physical Exam:   Constitution: NAD, appears stated age HENT: Eyes:  Cardio: RRR, no m/r/g, no LE edema  Respiratory: CTA, no w/r/r Abdominal: NTTP, soft, non-distended MSK: moving all extremities Neuro: normal affect, a&ox3 GU: Skin: c/d/i    There were no vitals filed for this visit. ***  Assessment & Plan:   See Encounters Tab for problem based charting.  Patient {GC/GE:3044014::"discussed with","seen with"} Dr. {NAMES:[id number]::"Butcher","Gran[place]a","E. Hoffman","Klima","Mullen","Narendra","Raines","Vincent"}

## 2020-09-24 ENCOUNTER — Other Ambulatory Visit: Payer: Self-pay

## 2020-09-24 ENCOUNTER — Ambulatory Visit
Admission: RE | Admit: 2020-09-24 | Discharge: 2020-09-24 | Disposition: A | Discharge status: Home / Self Care | Payer: No Typology Code available for payment source | Source: Ambulatory Visit | Attending: Neurosurgery | Admitting: Neurosurgery

## 2020-09-24 DIAGNOSIS — D352 Benign neoplasm of pituitary gland: Secondary | ICD-10-CM

## 2020-09-24 MED ORDER — GADOBENATE DIMEGLUMINE 529 MG/ML IV SOLN
10.0000 mL | Freq: Once | INTRAVENOUS | Status: AC | PRN
  Administered 2020-09-24: 10 mL via INTRAVENOUS

## 2020-12-15 ENCOUNTER — Ambulatory Visit: Payer: Self-pay

## 2020-12-15 ENCOUNTER — Emergency Department (HOSPITAL_COMMUNITY)
Admission: EM | Admit: 2020-12-15 | Discharge: 2020-12-15 | Disposition: A | Discharge status: Home / Self Care | Payer: No Typology Code available for payment source | Attending: Emergency Medicine | Admitting: Emergency Medicine

## 2020-12-15 ENCOUNTER — Encounter (HOSPITAL_COMMUNITY): Payer: Self-pay

## 2020-12-15 ENCOUNTER — Other Ambulatory Visit: Payer: Self-pay

## 2020-12-15 ENCOUNTER — Other Ambulatory Visit (HOSPITAL_COMMUNITY): Payer: Self-pay | Admitting: Student

## 2020-12-15 DIAGNOSIS — Z86018 Personal history of other benign neoplasm: Secondary | ICD-10-CM | POA: Insufficient documentation

## 2020-12-15 DIAGNOSIS — K047 Periapical abscess without sinus: Secondary | ICD-10-CM | POA: Diagnosis not present

## 2020-12-15 DIAGNOSIS — K0889 Other specified disorders of teeth and supporting structures: Secondary | ICD-10-CM | POA: Diagnosis present

## 2020-12-15 MED ORDER — CLINDAMYCIN HCL 150 MG PO CAPS
300.0000 mg | ORAL_CAPSULE | Freq: Once | ORAL | Status: AC
  Administered 2020-12-15: 300 mg via ORAL
  Filled 2020-12-15: qty 2

## 2020-12-15 MED ORDER — BENZOCAINE 20 % MT GEL: 1.0000 "application " | Freq: Four times a day (QID) | OROMUCOSAL | 0 refills | Status: AC | PRN

## 2020-12-15 MED ORDER — KETOROLAC TROMETHAMINE 30 MG/ML IJ SOLN
30.0000 mg | Freq: Once | INTRAMUSCULAR | Status: AC
  Administered 2020-12-15: 30 mg via INTRAMUSCULAR
  Filled 2020-12-15: qty 1

## 2020-12-15 MED ORDER — CLINDAMYCIN HCL 300 MG PO CAPS: 300.0000 mg | ORAL_CAPSULE | Freq: Three times a day (TID) | ORAL | 0 refills | Status: AC

## 2020-12-15 MED ORDER — NAPROXEN 500 MG PO TABS: 500.0000 mg | ORAL_TABLET | Freq: Two times a day (BID) | ORAL | 0 refills | Status: AC

## 2020-12-15 MED FILL — NAPROXEN 500 MG TABLET: 500 | 15 days supply | Qty: 30 | Fill #0

## 2020-12-15 MED FILL — CLINDAMYCIN HCL 300 MG CAPS: 300 | 7 days supply | Qty: 21 | Fill #0

## 2020-12-15 NOTE — ED Triage Notes (Signed)
Pt reports right sided dental pain for the past week, pain radiates down to her neck. Pain with swallowing, denies any difficulty breathing, no significant swelling noted.

## 2020-12-15 NOTE — ED Provider Notes (Signed)
[facility] EMERGENCY DEPARTMENT Provider Note   CSN: [id number] Arrival date & time: 12/15/20  4098     History Chief Complaint  Patient presents with  . Dental Pain    [name] is a 30 y.o. female with a past medical history significant for pituitary tumor who presents to the ED due to right-sided dental pain that has worsened over the past few days.  Patient states it radiates down the right aspect of her neck.  Pain is worse with mastication.  Last saw dentist roughly 6 months ago.  She has tried over-the-counter topical medication with no relief.  Denies changes to phonation, difficulty swallowing, cheek edema, and difficulties breathing.  Denies fever and chills.  History obtained from patient and past medical records. No interpreter used during encounter.      History reviewed. No pertinent past medical history.  Patient Active Problem List   Diagnosis Date Noted  . Pituitary tumor 06/28/2020  . 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 Pituitary macroadenoma ([place]) 06/07/2020  . Contraception management 03/22/2019  . Health care maintenance 03/22/2019    Past Surgical History:  Procedure Laterality Date  . CRANIOTOMY N/A 06/28/2020   Procedure: Transeptal Transphenoidal resection of pituitary tumor;  Surgeon: Newman Pies, MD;  Location: [facility];  Service: Neurosurgery;  Laterality: N/A;  . NO PAST SURGERIES    . TRANSNASAL APPROACH N/A 06/28/2020   Procedure: Transnasal Approach for resection of Pituitary Tumor;  Surgeon: Rozetta Nunnery, MD;  Location: [facility];  Service: ENT;  Laterality: N/A;     OB History   No obstetric history on file.     No family history on file.  Social History   Tobacco Use  . Smoking status: Never Smoker  . Smokeless tobacco: Never Used  Vaping Use  . Vaping Use: Never used  Substance Use Topics  . Alcohol use: Yes    Comment: sociall  . Drug use: No    Home  Medications Prior to Admission medications   Medication Sig Start Date End Date Taking? Authorizing Provider  benzocaine (HURRICAINE) 20 % GEL Use as directed 1 application in the mouth or throat 4 (four) times daily as needed. 12/15/20  Yes Daryon Remmert, Druscilla Brownie, PA-C  clindamycin (CLEOCIN) 300 MG capsule Take 1 capsule (300 mg total) by mouth 3 (three) times daily for 7 days. 12/15/20 12/22/20 Yes Tersa Fotopoulos, Druscilla Brownie, PA-C  naproxen (NAPROSYN) 500 MG tablet Take 1 tablet (500 mg total) by mouth 2 (two) times daily. 12/15/20  Yes Niv Darley, Druscilla Brownie, PA-C  norgestimate-ethinyl estradiol (SPRINTEC 28) 0.25-35 MG-MCG tablet Take 1 tablet by mouth daily. 08/11/20   [name], MD    Allergies    Patient has no known allergies.  Review of Systems   Review of Systems  Constitutional: Negative for chills and fever.  HENT: Positive for dental problem and sore throat. Negative for drooling, facial swelling, trouble swallowing and voice change.   All other systems reviewed and are negative.   Physical Exam Updated Vital Signs BP (!) 112/94 (BP Location: Right Arm)   Pulse 95   Temp 99 F (37.2 C)   Resp 14   LMP 12/01/2020   SpO2 99%   Physical Exam Vitals and nursing note reviewed.  Constitutional:      General: She is not in acute distress. HENT:     Head: Normocephalic.     Mouth/Throat:     Comments: Good dentition throughout. Tenderness throughout  right retromolar trigone. No abscess. 2 finger trismus.  Normal phonation.  Patient tolerating oral secretions without difficulty.  Tongue in normal position without protrusion.  No tenderness below tongue.  No tonsillar hypertrophy or exudates.  No erythema throughout oropharynx. No overlying cheek or jaw edema. Eyes:     Pupils: Pupils are equal, round, and reactive to light.  Neck:     Comments: No meningismus. Cardiovascular:     Rate and Rhythm: Normal rate and regular rhythm.     Pulses: Normal pulses.     Heart sounds:  Normal heart sounds. No murmur heard. No friction rub. No gallop.   Pulmonary:     Effort: Pulmonary effort is normal.     Breath sounds: Normal breath sounds.  Abdominal:     General: Abdomen is flat. Bowel sounds are normal. There is no distension.     Palpations: Abdomen is soft.     Tenderness: There is no abdominal tenderness. There is no guarding or rebound.  Musculoskeletal:     Cervical back: Neck supple.  Neurological:     General: No focal deficit present.     ED Results / Procedures / Treatments   Labs (all labs ordered are listed, but only abnormal results are displayed) Labs Reviewed - No data to display  EKG None  Radiology No results found.  Procedures Procedures   Medications Ordered in ED Medications  ketorolac (TORADOL) 30 MG/ML injection 30 mg (has no administration in time range)  clindamycin (CLEOCIN) capsule 300 mg (has no administration in time range)    ED Course  I have reviewed the triage vital signs and the nursing notes.  Pertinent labs & imaging results that were available during my care of the patient were reviewed by me and considered in my medical decision making (see chart for details).    MDM Rules/Calculators/A&P                         30 year old female presents to the ED due to right-sided dental pain that is worsened over the past few days.  Last saw dentist roughly 6 months ago.  No fever or chills.  Upon arrival, stable vitals.  Patient in no acute distress and nontoxic-appearing.  Physical exam significant for tenderness throughout right retromolar trigone.  No abscess appreciated on exam.  Normal phonation.  Patient tolerating oral secretions without difficulty.  Low suspicion for Ludwig's or deep space infection.  No meningismus.  No tonsillar hypertrophy or exudates.  Low suspicion for bacterial pharyngitis. Suspect symptoms related to impacted wisdom tooth with possible dental infection. No abscess. Patient treated with Toradol  and clindamycin here in the ED. Patient denied any concerns about pregnancy. Patient discharged with naproxen and clindamycin.  Instructed patient to call dentist for further evaluation.  Dental resources given to patient at discharge. Strict ED precautions discussed with patient. Patient states understanding and agrees to plan. Patient discharged home in no acute distress and stable vitals.  Final Clinical Impression(s) / ED Diagnoses Final diagnoses:  Dental infection    Rx / DC Orders ED Discharge Orders         Ordered    clindamycin (CLEOCIN) 300 MG capsule  3 times daily        12/15/20 0926    naproxen (NAPROSYN) 500 MG tablet  2 times daily        12/15/20 0926    benzocaine (HURRICAINE) 20 % GEL  4 times daily PRN  12/15/20 0926           Suzy Bouchard, PA-C 12/15/20 [place], MD 12/15/20 [phone]

## 2020-12-15 NOTE — Discharge Instructions (Addendum)
As discussed, your pain is most likely related to a dental infection where your wisdom teeth may be coming in.  I did not see an abscess on physical exam.  I am sending you home with an antibiotic and pain medication.  Take antibiotic as prescribed and finish all antibiotics. Do not take pain medication if there is any chance that you could be pregnant.  Please call your dentist for further evaluation.  I have also included dental resources in the community.  Return to the ER for new or worsening symptoms.

## 2020-12-16 ENCOUNTER — Ambulatory Visit: Payer: Self-pay

## 2021-04-10 ENCOUNTER — Encounter: Payer: Self-pay | Admitting: *Deleted

## 2022-03-30 ENCOUNTER — Encounter: Payer: Self-pay | Admitting: *Deleted
